# Patient Record
Sex: Female | Born: 1975
Health system: Southern US, Community
[De-identification: ages and names within clinical notes are randomized; demographics above are authoritative.]

## PROBLEM LIST (undated history)

## (undated) DIAGNOSIS — F3181 Bipolar II disorder: Secondary | ICD-10-CM

## (undated) DIAGNOSIS — G43909 Migraine, unspecified, not intractable, without status migrainosus: Secondary | ICD-10-CM

## (undated) DIAGNOSIS — G4730 Sleep apnea, unspecified: Secondary | ICD-10-CM

## (undated) DIAGNOSIS — F419 Anxiety disorder, unspecified: Secondary | ICD-10-CM

## (undated) DIAGNOSIS — E282 Polycystic ovarian syndrome: Secondary | ICD-10-CM

## (undated) DIAGNOSIS — G629 Polyneuropathy, unspecified: Secondary | ICD-10-CM

## (undated) DIAGNOSIS — M797 Fibromyalgia: Secondary | ICD-10-CM

## (undated) HISTORY — DX: Sleep apnea, unspecified: G47.30

## (undated) HISTORY — PX: CHOLECYSTECTOMY: SHX55

## (undated) HISTORY — DX: Morbid (severe) obesity due to excess calories: E66.01

## (undated) HISTORY — DX: Polyneuropathy, unspecified: G62.9

## (undated) HISTORY — DX: Migraine, unspecified, not intractable, without status migrainosus: G43.909

## (undated) HISTORY — DX: Anxiety disorder, unspecified: F41.9

## (undated) HISTORY — DX: Fibromyalgia: M79.7

## (undated) HISTORY — PX: OTHER SURGICAL HISTORY: SHX169

## (undated) HISTORY — PX: WISDOM TOOTH EXTRACTION: SHX21

## (undated) NOTE — *Deleted (*Deleted)
Chronic rhinitis  Continue cetirizine 10 mg once a day as needed for runny nose Continue saline nasal rinse or saline nasal spray as needed for nasal symptoms. Continue to follow-up with neurology concerning your migraines  Please let us know if this treatment plan is not working well for you Schedule follow-up appointment

---

## 1997-08-04 ENCOUNTER — Emergency Department (HOSPITAL_COMMUNITY): Admission: EM | Admit: 1997-08-04 | Discharge: 1997-08-04 | Payer: Self-pay | Admitting: Emergency Medicine

## 1997-11-08 ENCOUNTER — Emergency Department (HOSPITAL_COMMUNITY): Admission: EM | Admit: 1997-11-08 | Discharge: 1997-11-08 | Payer: Self-pay | Admitting: Family Medicine

## 1998-03-30 ENCOUNTER — Ambulatory Visit (HOSPITAL_COMMUNITY): Admission: RE | Admit: 1998-03-30 | Discharge: 1998-03-30 | Payer: Self-pay | Admitting: Family Medicine

## 1998-03-30 ENCOUNTER — Encounter: Payer: Self-pay | Admitting: Family Medicine

## 1998-11-29 ENCOUNTER — Emergency Department (HOSPITAL_COMMUNITY): Admission: EM | Admit: 1998-11-29 | Discharge: 1998-11-29 | Payer: Self-pay | Admitting: Emergency Medicine

## 1998-12-02 ENCOUNTER — Ambulatory Visit (HOSPITAL_COMMUNITY): Admission: RE | Admit: 1998-12-02 | Discharge: 1998-12-02 | Payer: Self-pay | Admitting: *Deleted

## 1998-12-02 ENCOUNTER — Encounter: Payer: Self-pay | Admitting: *Deleted

## 1998-12-15 ENCOUNTER — Ambulatory Visit (HOSPITAL_COMMUNITY): Admission: RE | Admit: 1998-12-15 | Discharge: 1998-12-15 | Payer: Self-pay | Admitting: Radiation Oncology

## 1999-01-04 ENCOUNTER — Ambulatory Visit (HOSPITAL_COMMUNITY): Admission: RE | Admit: 1999-01-04 | Discharge: 1999-01-05 | Payer: Self-pay | Admitting: Surgery

## 1999-01-04 ENCOUNTER — Encounter (INDEPENDENT_AMBULATORY_CARE_PROVIDER_SITE_OTHER): Payer: Self-pay | Admitting: Specialist

## 1999-03-31 ENCOUNTER — Encounter (INDEPENDENT_AMBULATORY_CARE_PROVIDER_SITE_OTHER): Payer: Self-pay | Admitting: Specialist

## 1999-03-31 ENCOUNTER — Ambulatory Visit (HOSPITAL_COMMUNITY): Admission: RE | Admit: 1999-03-31 | Discharge: 1999-03-31 | Payer: Self-pay | Admitting: Gastroenterology

## 1999-06-16 ENCOUNTER — Ambulatory Visit (HOSPITAL_COMMUNITY): Admission: RE | Admit: 1999-06-16 | Discharge: 1999-06-16 | Payer: Self-pay | Admitting: Family Medicine

## 1999-06-16 ENCOUNTER — Encounter: Payer: Self-pay | Admitting: Family Medicine

## 1999-07-15 ENCOUNTER — Ambulatory Visit (HOSPITAL_COMMUNITY): Admission: RE | Admit: 1999-07-15 | Discharge: 1999-07-15 | Payer: Self-pay | Admitting: Gastroenterology

## 1999-07-15 ENCOUNTER — Encounter: Payer: Self-pay | Admitting: Gastroenterology

## 1999-07-19 ENCOUNTER — Emergency Department (HOSPITAL_COMMUNITY): Admission: EM | Admit: 1999-07-19 | Discharge: 1999-07-19 | Payer: Self-pay | Admitting: Emergency Medicine

## 2002-04-30 ENCOUNTER — Encounter: Payer: Self-pay | Admitting: Emergency Medicine

## 2002-04-30 ENCOUNTER — Emergency Department (HOSPITAL_COMMUNITY): Admission: EM | Admit: 2002-04-30 | Discharge: 2002-04-30 | Payer: Self-pay | Admitting: Emergency Medicine

## 2002-05-09 ENCOUNTER — Other Ambulatory Visit: Admission: RE | Admit: 2002-05-09 | Discharge: 2002-05-09 | Payer: Self-pay | Admitting: Gynecology

## 2004-06-14 ENCOUNTER — Ambulatory Visit: Payer: Self-pay | Admitting: Psychiatry

## 2004-06-14 ENCOUNTER — Inpatient Hospital Stay (HOSPITAL_COMMUNITY): Admission: EM | Admit: 2004-06-14 | Discharge: 2004-06-17 | Payer: Self-pay | Admitting: Psychiatry

## 2004-06-16 ENCOUNTER — Ambulatory Visit (HOSPITAL_COMMUNITY): Admission: RE | Admit: 2004-06-16 | Discharge: 2004-06-16 | Payer: Self-pay | Admitting: Psychiatry

## 2006-03-28 ENCOUNTER — Emergency Department (HOSPITAL_COMMUNITY): Admission: EM | Admit: 2006-03-28 | Discharge: 2006-03-28 | Payer: Self-pay | Admitting: Emergency Medicine

## 2006-07-28 ENCOUNTER — Other Ambulatory Visit: Admission: RE | Admit: 2006-07-28 | Discharge: 2006-07-28 | Payer: Self-pay | Admitting: Family Medicine

## 2006-10-26 ENCOUNTER — Encounter: Admission: RE | Admit: 2006-10-26 | Discharge: 2007-01-15 | Payer: Self-pay | Admitting: *Deleted

## 2007-04-17 ENCOUNTER — Encounter: Admission: RE | Admit: 2007-04-17 | Discharge: 2007-04-17 | Payer: Self-pay | Admitting: Family Medicine

## 2007-08-29 ENCOUNTER — Emergency Department (HOSPITAL_COMMUNITY): Admission: EM | Admit: 2007-08-29 | Discharge: 2007-08-29 | Payer: Self-pay | Admitting: Physician Assistant

## 2007-09-01 ENCOUNTER — Other Ambulatory Visit: Payer: Self-pay

## 2007-09-01 ENCOUNTER — Other Ambulatory Visit: Payer: Self-pay | Admitting: Emergency Medicine

## 2007-09-02 ENCOUNTER — Inpatient Hospital Stay (HOSPITAL_COMMUNITY): Admission: AD | Admit: 2007-09-02 | Discharge: 2007-09-05 | Payer: Self-pay | Admitting: *Deleted

## 2007-09-02 ENCOUNTER — Ambulatory Visit: Payer: Self-pay | Admitting: *Deleted

## 2008-04-08 ENCOUNTER — Other Ambulatory Visit: Admission: RE | Admit: 2008-04-08 | Discharge: 2008-04-08 | Payer: Self-pay | Admitting: Family Medicine

## 2008-09-15 ENCOUNTER — Encounter: Admission: RE | Admit: 2008-09-15 | Discharge: 2008-09-15 | Payer: Self-pay | Admitting: Family Medicine

## 2009-03-09 ENCOUNTER — Emergency Department (HOSPITAL_COMMUNITY): Admission: EM | Admit: 2009-03-09 | Discharge: 2009-03-10 | Payer: Self-pay | Admitting: Emergency Medicine

## 2009-04-27 ENCOUNTER — Encounter: Admission: RE | Admit: 2009-04-27 | Discharge: 2009-04-27 | Payer: Self-pay | Admitting: Family Medicine

## 2009-11-04 ENCOUNTER — Emergency Department (HOSPITAL_COMMUNITY): Admission: EM | Admit: 2009-11-04 | Discharge: 2009-11-04 | Payer: Self-pay | Admitting: Emergency Medicine

## 2010-03-22 ENCOUNTER — Encounter: Payer: Self-pay | Admitting: Family Medicine

## 2010-04-13 ENCOUNTER — Other Ambulatory Visit (HOSPITAL_COMMUNITY)
Admission: RE | Admit: 2010-04-13 | Discharge: 2010-04-13 | Disposition: A | Discharge status: Home / Self Care | Payer: PRIVATE HEALTH INSURANCE | Source: Ambulatory Visit | Attending: Family Medicine | Admitting: Family Medicine

## 2010-04-13 ENCOUNTER — Other Ambulatory Visit: Payer: Self-pay | Admitting: Family Medicine

## 2010-04-13 DIAGNOSIS — Z01419 Encounter for gynecological examination (general) (routine) without abnormal findings: Secondary | ICD-10-CM | POA: Insufficient documentation

## 2010-05-13 LAB — POCT URINALYSIS DIPSTICK
Bilirubin Urine: NEGATIVE
Glucose, UA: NEGATIVE mg/dL
Protein, ur: NEGATIVE mg/dL
Specific Gravity, Urine: 1.015 (ref 1.005–1.030)
Urobilinogen, UA: 0.2 mg/dL (ref 0.0–1.0)
pH: 6 (ref 5.0–8.0)

## 2010-05-13 LAB — POCT PREGNANCY, URINE: Preg Test, Ur: NEGATIVE

## 2010-05-16 LAB — COMPREHENSIVE METABOLIC PANEL
ALT: 17 U/L (ref 0–35)
AST: 16 U/L (ref 0–37)
Albumin: 3.3 g/dL — ABNORMAL LOW (ref 3.5–5.2)
Alkaline Phosphatase: 81 U/L (ref 39–117)
CO2: 27 mEq/L (ref 19–32)
Chloride: 102 mEq/L (ref 96–112)
GFR calc Af Amer: >60 mL/min (ref >60)
GFR calc non Af Amer: >60 mL/min (ref >60)
Potassium: 3.9 mEq/L (ref 3.5–5.1)
Sodium: 136 mEq/L (ref 135–145)
Total Bilirubin: 0.3 mg/dL (ref 0.3–1.2)

## 2010-05-16 LAB — DIFFERENTIAL
Basophils Relative: 0 % (ref 0–1)
Eosinophils Absolute: 0.3 10*3/uL (ref 0.0–0.7)
Lymphocytes Relative: 19 % (ref 12–46)
Lymphs Abs: 2.7 10*3/uL (ref 0.7–4.0)
Monocytes Absolute: 0.7 10*3/uL (ref 0.1–1.0)

## 2010-05-16 LAB — CBC
Hemoglobin: 12.4 g/dL (ref 12.0–15.0)
MCHC: 32.7 g/dL (ref 30.0–36.0)
RBC: 4.65 MIL/uL (ref 3.87–5.11)

## 2010-05-16 LAB — CK TOTAL AND CKMB (NOT AT ARMC): Total CK: 76 U/L (ref 7–177)

## 2010-05-16 LAB — TROPONIN I: Troponin I: 0.01 ng/mL (ref 0.00–0.06)

## 2010-05-16 LAB — D-DIMER, QUANTITATIVE: D-Dimer, Quant: 0.27 ug/mL-FEU (ref 0.00–0.48)
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
## 2010-07-16 NOTE — Procedures (Signed)
[facility]  Patient:    Krystal French, Krystal French                        MRN: [id number] Proc. Date: 07/15/99 Adm. Date:  [id number] Disc. Date: [id number] Attending:  Devoria Albe CC:         Meredith Staggers, M.D.             Abigail Miyamoto, M.D.                           Procedure Report  PROCEDURE:  Endoscopic retrograde cholangiopancreatography with sphincterotomy.  INDICATIONS FOR PROCEDURE:  The patient is status post cholecystectomy with gallstones found at the time of surgery who has continued to have epigastric abdominal pain and nausea as well as some lower abdominal cramps and diarrhea. She has not responded to antipeptic medications and antispasmodics.  Abdominal CT scan and EGD were all essentially unrevealing.  We have decided to pursue ERCP to rule out common bile duct stone, since intraoperative cholangiogram was not obtained at surgery.   The risks of the procedure including pancreatitis, bleeding and perforation, as well as the limitations and occasional failure to cannulate the  common bile duct were explained to the patient and her parents and husband, and  they all wish to proceed.  DESCRIPTION OF PROCEDURE:  The Olympus side viewing endoscope was advanced blindly into the oropharynx, esophagus and stomach.  Examination of the stomach was unremarkable.  The pylorus was traversed and the papilla of Vater located on the medial duodenal wall.  It had a normal appearance and bile was seen to exit from the ampulla.  With the Wilson-Cook sphincterotome, shallow cannulation of the common bile duct was achieved and a cholangiogram was obtained.  It appeared that there was a round filling defect in the distal duct, and a cholangiogram was obtained demonstrating this.  It appeared consistent with a stone.  However, after deep cannulation with a guidewire and subsequent deep cannulation with the sphincterotome, this filling defect was no  longer seen.  In consultation with Dr. Anselmo Pickler, the films were reviewed, and it was felt that this could represent a stone, but could possibility have represented a kink in the distal duct caused y the initial pressure of the sphincterotome on the ampulla.  It was not possible to completely resolve this issue, but filling defects were not seen on any other cholangiogram.  The pancreatic duct was not injected.  After careful consideration of the radiologic findings and the clinical scenario, I decided to perform a sphincterotomy.  It should be noted that the common bile duct was smooth and not dilated, and the cystic duct stump was clearly identified with no stone seen there. A 1 cm sphincterotomy was performed and then the sphincterotome was withdrawn and a  12 mm balloon catheter advanced high into the common hepatic duct, inflated and withdrawn with no filling defect seen.  It was reintroduced, inflated distally nd repeat cholangiogram obtained.  Again, no filling defects were introduced, but ne more pass was made with the balloon.  The scope was then withdrawn and the patient returned to the recovery room in stable condition.  She tolerated the procedure  well and there were no immediate complications.  IMPRESSION:  Question of common bile duct stone seen primarily on one film status post sphincterotomy and balloon sweep with no stone seen to be delivered.  PLAN:  Will  advance diet and observe response to procedure.  Pursue further workup as indicated based on response. DD:  07/15/99 TD:  07/20/99 Job: [id number]
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
## 2010-07-16 NOTE — Discharge Summary (Signed)
Krystal French, Krystal French NO.:  [id number]   MEDICAL RECORD NO.:  [id number]          PATIENT TYPE:  IPS   LOCATION:  0305                          FACILITY:  [facility]   PHYSICIAN:  Anselm Jungling, MD  DATE OF BIRTH:  Jan 02, 1976   DATE OF ADMISSION:  09/02/2007  DATE OF DISCHARGE:  09/05/2007                               DISCHARGE SUMMARY   IDENTIFYING DATA/REASON FOR ADMISSION:  The patient is a 35 year old  married white female admitted due to worsening depression and in the  aftermath of an overdose suicide attempt.  She came to [place] with a history  of bipolar disorder and reported history of frequent cycling.  Her usual  psychotropic medications included Abilify, Topamax, Lamictal, Effexor,  and Klonopin.  She had been seeing Dr. Darlin Drop for her psychiatric  medication management.  Please refer to the admission note for further  details pertaining to the symptoms, circumstances, and history that led  to her hospitalization.  She was given an initial Axis I diagnosis of  depressive disorder NOS and history of bipolar disorder NOS.   MEDICAL AND LABORATORY:  The patient was medically and physically  assessed by the psychiatric nurse practitioner upon admission.  She was  somewhat overweight, but generally in good health without any active or  chronic medical problems.  There were no significant medical issues.   HOSPITAL COURSE:  The patient was admitted to the adult inpatient  psychiatric service.  She presented as an obese female who initially was  interviewed in her room, as she remained in bed.  She was awake but  responded with little interaction.  She was fully oriented.  She made no  delusional statements.  She indicated that she wanted to sleep.  She had  denied suicidal ideation since admission and had expressed regret for  her overdose.  She expressed a desire for help.   She was continued on her usual regimen of Lamictal, Effexor, Abilify and  Topamax.  She was involved in therapeutic groups and activities.   She was more available to discuss the situation on the second full  hospital day.  When I met with her, she stated, I feel really stupid;  it was not a smart thing to do.  She reported that this was her first  ever suicide attempt.  She indicated that her husband was supportive,  and later that day they had a family meeting.   The patient explained that in the past her depressive cycles had  usually resolved spontaneously after only a few days; however, this  episode lasted 2 to 3 weeks despite efforts at medication adjustments  that unfortunately had been unsuccessful.  Also, shortly prior to this  bout of depression, she had had an apparent gastrointestinal illness  that involved some dehydration.   The patient and her husband met for a family session.  They discussed  precursors to her admission, her progress, and aftercare needs.  The  patient was able to contract for safety at that time and indicated that  she felt comfortable with discharge home.  Her husband was  comfortable  with this as well.  She requested referral to an individual  psychotherapist, which she had not had prior to admission.   AFTERCARE:  The patient was to follow up with Darlin Drop on September 10, 2007.  She was also referred to [facility] for individual psychotherapy  with an appointment on September 06, 2007, at 2:30 p.m.   DISCHARGE MEDICATIONS:  Lamictal 200 mg q.h.s., Effexor XR 150 mg  q.h.s., Abilify 15 mg q.h.s., and Topamax 100 mg q.h.s.   DISCHARGE DIAGNOSES:  AXIS I:  Bipolar disorder NOS, most recently  depressed without psychotic features, resolving.  AXIS II:  Deferred.  AXIS III:  No acute or chronic illnesses.  AXIS IV:  Stressors severe.  AXIS V:  GAF on discharge 65.      Anselm Jungling, MD  Electronically Signed     SPB/MEDQ  D:  09/11/2007  T:  09/11/2007  Job:  [id number]

## 2010-11-25 LAB — COMPREHENSIVE METABOLIC PANEL
Albumin: 3.6
BUN: 9
Chloride: 104
Creatinine, Ser: 1.1
Total Bilirubin: 0.4
Total Protein: 7

## 2010-11-25 LAB — URINALYSIS, ROUTINE W REFLEX MICROSCOPIC
Bilirubin Urine: NEGATIVE
Hgb urine dipstick: NEGATIVE
Nitrite: NEGATIVE
Specific Gravity, Urine: 1.024

## 2010-11-25 LAB — RAPID URINE DRUG SCREEN, HOSP PERFORMED
Amphetamines: NOT DETECTED
Benzodiazepines: NOT DETECTED
Cocaine: NOT DETECTED
Opiates: NOT DETECTED

## 2010-11-25 LAB — DIFFERENTIAL
Basophils Absolute: 0.1
Eosinophils Absolute: 0.1
Eosinophils Relative: 1

## 2010-11-25 LAB — POCT I-STAT, CHEM 8
BUN: 9
Calcium, Ion: 1.24
Chloride: 105
Creatinine, Ser: 1.1
HCT: 47 — ABNORMAL HIGH
Hemoglobin: 16 — ABNORMAL HIGH
Sodium: 138

## 2010-11-25 LAB — BASIC METABOLIC PANEL
BUN: 10
CO2: 25
GFR calc non Af Amer: >60
Glucose, Bld: 103 — ABNORMAL HIGH
Potassium: 4
Sodium: 136

## 2010-11-25 LAB — CBC
HCT: 42.8
MCV: 83.1
Platelets: 293
RDW: 13.8

## 2010-11-25 LAB — URINE MICROSCOPIC-ADD ON

## 2010-11-25 LAB — SALICYLATE LEVEL: Salicylate Lvl: <4

## 2010-11-25 LAB — TRICYCLICS SCREEN, URINE: TCA Scrn: NOT DETECTED

## 2011-02-28 ENCOUNTER — Ambulatory Visit
Admission: RE | Admit: 2011-02-28 | Discharge: 2011-02-28 | Disposition: A | Discharge status: Home / Self Care | Payer: PRIVATE HEALTH INSURANCE | Source: Ambulatory Visit | Attending: Family Medicine | Admitting: Family Medicine

## 2011-02-28 ENCOUNTER — Other Ambulatory Visit: Payer: Self-pay | Admitting: Family Medicine

## 2011-02-28 DIAGNOSIS — R109 Unspecified abdominal pain: Secondary | ICD-10-CM

## 2011-08-01 ENCOUNTER — Ambulatory Visit
Admission: RE | Admit: 2011-08-01 | Discharge: 2011-08-01 | Disposition: A | Discharge status: Home / Self Care | Payer: PRIVATE HEALTH INSURANCE | Source: Ambulatory Visit | Attending: Family Medicine | Admitting: Family Medicine

## 2011-08-01 ENCOUNTER — Other Ambulatory Visit: Payer: Self-pay | Admitting: Family Medicine

## 2011-08-01 DIAGNOSIS — M25569 Pain in unspecified knee: Secondary | ICD-10-CM

## 2011-10-02 ENCOUNTER — Emergency Department (HOSPITAL_COMMUNITY): Payer: Medicare Other

## 2011-10-02 ENCOUNTER — Encounter (HOSPITAL_COMMUNITY): Payer: Self-pay | Admitting: *Deleted

## 2011-10-02 ENCOUNTER — Emergency Department (HOSPITAL_COMMUNITY)
Admission: EM | Admit: 2011-10-02 | Discharge: 2011-10-02 | Disposition: A | Discharge status: Home / Self Care | Payer: Medicare Other | Attending: Emergency Medicine | Admitting: Emergency Medicine

## 2011-10-02 DIAGNOSIS — R079 Chest pain, unspecified: Secondary | ICD-10-CM | POA: Insufficient documentation

## 2011-10-02 DIAGNOSIS — F3189 Other bipolar disorder: Secondary | ICD-10-CM | POA: Insufficient documentation

## 2011-10-02 DIAGNOSIS — Z79899 Other long term (current) drug therapy: Secondary | ICD-10-CM | POA: Insufficient documentation

## 2011-10-02 DIAGNOSIS — Z9089 Acquired absence of other organs: Secondary | ICD-10-CM | POA: Insufficient documentation

## 2011-10-02 HISTORY — DX: Polycystic ovarian syndrome: E28.2

## 2011-10-02 HISTORY — DX: Bipolar II disorder: F31.81

## 2011-10-02 HISTORY — DX: Migraine, unspecified, not intractable, without status migrainosus: G43.909

## 2011-10-02 LAB — CBC WITH DIFFERENTIAL/PLATELET
Basophils Relative: 0 % (ref 0–1)
Eosinophils Absolute: 0.3 10*3/uL (ref 0.0–0.7)
Eosinophils Relative: 3 % (ref 0–5)
Hemoglobin: 12.4 g/dL (ref 12.0–15.0)
Lymphs Abs: 3.5 10*3/uL (ref 0.7–4.0)
MCH: 26.7 pg (ref 26.0–34.0)
MCHC: 32 g/dL (ref 30.0–36.0)
MCV: 83.4 fL (ref 78.0–100.0)
Monocytes Relative: 6 % (ref 3–12)
Neutrophils Relative %: 59 % (ref 43–77)
Platelets: 312 10*3/uL (ref 150–400)

## 2011-10-02 LAB — COMPREHENSIVE METABOLIC PANEL
Albumin: 3.3 g/dL — ABNORMAL LOW (ref 3.5–5.2)
Alkaline Phosphatase: 88 U/L (ref 39–117)
BUN: 9 mg/dL (ref 6–23)
Calcium: 9.4 mg/dL (ref 8.4–10.5)
GFR calc Af Amer: >90 mL/min (ref >90)
Glucose, Bld: 113 mg/dL — ABNORMAL HIGH (ref 70–99)
Potassium: 3.9 mEq/L (ref 3.5–5.1)
Total Protein: 7 g/dL (ref 6.0–8.3)

## 2011-10-02 LAB — PREGNANCY, URINE: Preg Test, Ur: NEGATIVE

## 2011-10-02 LAB — TROPONIN I
Troponin I: <0.3 ng/mL (ref <0.30)
Troponin I: <0.3 ng/mL (ref <0.30)

## 2011-10-02 LAB — D-DIMER, QUANTITATIVE: D-Dimer, Quant: 0.28 ug/mL-FEU (ref 0.00–0.48)

## 2011-10-02 MED ORDER — ASPIRIN 325 MG PO TABS
325.0000 mg | ORAL_TABLET | Freq: Once | ORAL | Status: AC
  Administered 2011-10-02: 325 mg via ORAL
  Filled 2011-10-02: qty 1

## 2011-10-02 MED ORDER — MORPHINE SULFATE 4 MG/ML IJ SOLN
4.0000 mg | Freq: Once | INTRAMUSCULAR | Status: AC
  Administered 2011-10-02: 4 mg via INTRAVENOUS
  Filled 2011-10-02: qty 1

## 2011-10-02 NOTE — ED Notes (Signed)
Discharge instructions explained to pt and pt was provided w/ a copy of D/C instructions.  Pt verbalized understanding of instructions.

## 2011-10-02 NOTE — ED Notes (Signed)
Patient is alert and oriented x3.  She is complaining of chest pain that started at 7am. She states that she does have a history of anxiety with chest pain that this chest pain  Feels different.  Currently she rates her pain at a 5 of 10 that radiates to her back and left  Arm.

## 2011-10-02 NOTE — ED Provider Notes (Addendum)
History     CSN: [id number]  Arrival date & time 10/02/11  4098   First MD Initiated Contact with Patient 10/02/11 [phone]      Chief Complaint  Patient presents with  . Chest Pain    (Consider location/radiation/quality/duration/timing/severity/associated sxs/prior treatment) HPI Comments: Krystal French is a 36 y.o. Female hx of bipolar, PCOS (not on OCP), OSA, here with CP. CP substernal, radiating to bilateral shoulders that woke her up this AM. Also some mild SOB but no wheezing or cough or fever. She felt it may be anxiety and took 1mg  of ativan and didn't feel better. Pain 5/10 currently. Not on OCP, no recent travel or leg swelling. She is never a smoker but has family hx of HL and grandparents had MIs.    The history is provided by the patient.    Past Medical History  Diagnosis Date  . Bipolar 2 disorder   . Migraines   . Polycystic ovaries     Past Surgical History  Procedure Date  . Cholecystectomy   . Wisdom tooth extraction     History reviewed. No pertinent family history.  History  Substance Use Topics  . Smoking status: Never Smoker   . Smokeless tobacco: Not on file  . Alcohol Use: No    OB History    Grav Para Term Preterm Abortions TAB SAB Ect Mult Living                  Review of Systems  Respiratory: Positive for chest tightness and shortness of breath.   Cardiovascular: Positive for chest pain.  All other systems reviewed and are negative.    Allergies  Latex and Sulfa antibiotics  Home Medications   Current Outpatient Rx  Name Route Sig Dispense Refill  . ACETAMINOPHEN 500 MG PO TABS Oral Take 1,000 mg by mouth every 6 (six) hours as needed. For pain.    Marland Kitchen DIVALPROEX SODIUM ER 250 MG PO TB24 Oral Take 750 mg by mouth daily.    . OMEGA-3 FATTY ACIDS 1000 MG PO CAPS Oral Take 1 g by mouth daily.    Marland Kitchen FOLIC ACID 1 MG PO TABS Oral Take 1 mg by mouth daily.    Marland Kitchen LAMOTRIGINE 200 MG PO TABS Oral Take 200 mg by mouth daily.    Marland Kitchen  LORAZEPAM 2 MG PO TABS Oral Take 2 mg by mouth every 8 (eight) hours as needed. For anxiety/sleep.    . SERTRALINE HCL 100 MG PO TABS Oral Take 200 mg by mouth daily.      BP 149/79  Pulse 78  Resp 18  SpO2 98%  LMP 09/04/2011  Physical Exam  Constitutional: She is oriented to person, place, and time.       Morbidly obese, uncomfortable, tachypneic.   HENT:  Head: Normocephalic.  Eyes: Conjunctivae and EOM are normal. Pupils are equal, round, and reactive to light.  Neck: Normal range of motion. Neck supple.  Cardiovascular: Normal rate, normal heart sounds and intact distal pulses.   Pulmonary/Chest: Breath sounds normal.       Tachypneic, no wheezing or rales. Decreased breaths sound bilaterally possibly due to body habitus.   Abdominal: Soft. Bowel sounds are normal.  Musculoskeletal: Normal range of motion. She exhibits no edema.       No edema, no calf tenderness  Neurological: She is alert and oriented to person, place, and time.  Skin: Skin is warm.  Psychiatric: She has a normal mood and affect. Her  behavior is normal. Judgment and thought content normal.    ED Course  Procedures (including critical care time)  Labs Reviewed  CBC WITH DIFFERENTIAL - Abnormal; Notable for the following:    WBC 11.0 (*)     All other components within normal limits  COMPREHENSIVE METABOLIC PANEL - Abnormal; Notable for the following:    Glucose, Bld 113 (*)     Albumin 3.3 (*)     Total Bilirubin 0.1 (*)     GFR calc non Af Amer 82 (*)     All other components within normal limits  TROPONIN I  D-DIMER, QUANTITATIVE  PREGNANCY, URINE  TROPONIN I   Dg Chest 2 View  10/02/2011  *RADIOLOGY REPORT*  Clinical Data: chest pain  CHEST - 2 VIEW  Comparison: 03/10/2009  Findings: The heart size and mediastinal contours are within normal limits.  Both lungs are clear.  The visualized skeletal structures are unremarkable.  IMPRESSION: Negative examination.  Original Report Authenticated By:  Rosealee Albee, M.D.     1. Chest pain      Date: 10/02/2011  Rate: 80  Rhythm: normal sinus rhythm  QRS Axis: normal  Intervals: normal  ST/T Wave abnormalities: normal  Conduction Disutrbances:none  Narrative Interpretation:   Old EKG Reviewed: none available    MDM  Krystal French is a 36 y.o. female with hx of OSA, bipolar, PCOS with CP and SOB. Patient has no known CAD. Will need to get trop x 2, labs, cxr. Also consider PE given hx of PCOS (off OCP), will order D-dimer. Will reassess patient.   1:13 PM Patient is now pain free. Trop x 2 neg, d dimer nl. She has follow up. Will d/c with cardiology f/u.        Richardean Canal, MD 10/02/11 1313  Patient low risk for ACS can have outpatient f/u.   Richardean Canal, MD 10/02/11 1315

## 2011-10-07 ENCOUNTER — Other Ambulatory Visit: Payer: Self-pay | Admitting: Family Medicine

## 2011-10-07 ENCOUNTER — Other Ambulatory Visit (HOSPITAL_COMMUNITY)
Admission: RE | Admit: 2011-10-07 | Discharge: 2011-10-07 | Disposition: A | Discharge status: Home / Self Care | Payer: Medicare Other | Source: Ambulatory Visit | Attending: Family Medicine | Admitting: Family Medicine

## 2011-10-07 DIAGNOSIS — Z124 Encounter for screening for malignant neoplasm of cervix: Secondary | ICD-10-CM | POA: Insufficient documentation

## 2012-01-11 ENCOUNTER — Other Ambulatory Visit: Payer: Self-pay | Admitting: Dermatology

## 2012-05-09 ENCOUNTER — Other Ambulatory Visit: Payer: Self-pay | Admitting: Orthopedic Surgery

## 2012-05-09 DIAGNOSIS — M25561 Pain in right knee: Secondary | ICD-10-CM

## 2012-05-16 ENCOUNTER — Other Ambulatory Visit: Payer: Medicare Other

## 2012-09-07 ENCOUNTER — Emergency Department (HOSPITAL_COMMUNITY)
Admission: EM | Admit: 2012-09-07 | Discharge: 2012-09-07 | Disposition: A | Discharge status: Home / Self Care | Payer: Medicare Other | Attending: Emergency Medicine | Admitting: Emergency Medicine

## 2012-09-07 ENCOUNTER — Encounter (HOSPITAL_COMMUNITY): Payer: Self-pay

## 2012-09-07 DIAGNOSIS — IMO0001 Reserved for inherently not codable concepts without codable children: Secondary | ICD-10-CM | POA: Insufficient documentation

## 2012-09-07 DIAGNOSIS — Z8639 Personal history of other endocrine, nutritional and metabolic disease: Secondary | ICD-10-CM | POA: Insufficient documentation

## 2012-09-07 DIAGNOSIS — Z79899 Other long term (current) drug therapy: Secondary | ICD-10-CM | POA: Insufficient documentation

## 2012-09-07 DIAGNOSIS — Z862 Personal history of diseases of the blood and blood-forming organs and certain disorders involving the immune mechanism: Secondary | ICD-10-CM | POA: Insufficient documentation

## 2012-09-07 DIAGNOSIS — Z3202 Encounter for pregnancy test, result negative: Secondary | ICD-10-CM | POA: Insufficient documentation

## 2012-09-07 DIAGNOSIS — Z91040 Latex allergy status: Secondary | ICD-10-CM | POA: Insufficient documentation

## 2012-09-07 DIAGNOSIS — G43909 Migraine, unspecified, not intractable, without status migrainosus: Secondary | ICD-10-CM | POA: Insufficient documentation

## 2012-09-07 DIAGNOSIS — F319 Bipolar disorder, unspecified: Secondary | ICD-10-CM | POA: Insufficient documentation

## 2012-09-07 DIAGNOSIS — M797 Fibromyalgia: Secondary | ICD-10-CM

## 2012-09-07 LAB — ETHANOL: Alcohol, Ethyl (B): <11 mg/dL (ref 0–11)

## 2012-09-07 LAB — COMPREHENSIVE METABOLIC PANEL
AST: 11 U/L (ref 0–37)
Albumin: 3.7 g/dL (ref 3.5–5.2)
Alkaline Phosphatase: 77 U/L (ref 39–117)
BUN: 10 mg/dL (ref 6–23)
Chloride: 97 mEq/L (ref 96–112)
Potassium: 4.3 mEq/L (ref 3.5–5.1)
Sodium: 135 mEq/L (ref 135–145)
Total Bilirubin: 0.2 mg/dL — ABNORMAL LOW (ref 0.3–1.2)
Total Protein: 7.5 g/dL (ref 6.0–8.3)

## 2012-09-07 LAB — CBC
MCHC: 31.9 g/dL (ref 30.0–36.0)
Platelets: 290 10*3/uL (ref 150–400)
RDW: 14.4 % (ref 11.5–15.5)
WBC: 12.3 10*3/uL — ABNORMAL HIGH (ref 4.0–10.5)

## 2012-09-07 LAB — RAPID URINE DRUG SCREEN, HOSP PERFORMED
Amphetamines: NOT DETECTED
Benzodiazepines: NOT DETECTED
Tetrahydrocannabinol: NOT DETECTED

## 2012-09-07 MED ORDER — IBUPROFEN 200 MG PO TABS: 600.0000 mg | ORAL_TABLET | Freq: Three times a day (TID) | ORAL | Status: DC | PRN

## 2012-09-07 MED ORDER — ALUM & MAG HYDROXIDE-SIMETH 200-200-20 MG/5ML PO SUSP: 30.0000 mL | ORAL | Status: DC | PRN

## 2012-09-07 MED ORDER — ONDANSETRON HCL 4 MG PO TABS: 4.0000 mg | ORAL_TABLET | Freq: Three times a day (TID) | ORAL | Status: DC | PRN

## 2012-09-07 MED ORDER — ACETAMINOPHEN 325 MG PO TABS: 650.0000 mg | ORAL_TABLET | ORAL | Status: DC | PRN

## 2012-09-07 NOTE — ED Notes (Signed)
Pt states that shes on her third medication change for bipolar and she's having suicidal thoughts, she also complains about a fibromyalgia flare at this time
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
## 2012-09-07 NOTE — ED Notes (Signed)
Pt presents with feeling SI all day yesterday.  Denies HI, no AV hallucinations.  No hopeless feelings.  Pt admits to OD of pills x 4 yrs ago.  Pt calm & cooperative at present, denies SI at present.

## 2012-09-07 NOTE — ED Provider Notes (Signed)
History    CSN: [id number] Arrival date & time 09/07/12  0246  First MD Initiated Contact with Patient 09/07/12 0257     Chief Complaint  Patient presents with  . Medical Clearance   (Consider location/radiation/quality/duration/timing/severity/associated sxs/prior Treatment) HPI 37 yo female presents to the ER from home with her husband with c/o bipolar disorder and thoughts of suicide.  Pt recently dx with fibromyalgia, has had several recent medication changes in conjunction with her psychiatrist and rheumatologist.  Pt has prior h/o suicide attempts x 2, last about 5 years ago.  Pt was well controlled for 3 years but had changed SSRI on recommendation of rheumatologist.  She has not done well on celexa, and is being tapered down, changed to pristiq and latuda.  Pt has been "eyeing my ativan bottle".  Pt feels she is having a fibromyalgia flare as well.  Pt has f/u tomorrow with psychiatry, her therapist on Monday. Past Medical History  Diagnosis Date  . Bipolar 2 disorder   . Migraines   . Polycystic ovaries    Past Surgical History  Procedure Laterality Date  . Cholecystectomy    . Wisdom tooth extraction     History reviewed. No pertinent family history. History  Substance Use Topics  . Smoking status: Never Smoker   . Smokeless tobacco: Not on file  . Alcohol Use: No   OB History   Grav Para Term Preterm Abortions TAB SAB Ect Mult Living                 Review of Systems  All other systems reviewed and are negative.    Allergies  Latex and Sulfa antibiotics  Home Medications   Current Outpatient Rx  Name  Route  Sig  Dispense  Refill  . acetaminophen (TYLENOL) 500 MG tablet   Oral   Take 1,000 mg by mouth every 6 (six) hours as needed. For pain.         Marland Kitchen desvenlafaxine (PRISTIQ) 50 MG 24 hr tablet   Oral   Take 50 mg by mouth daily.         . divalproex (DEPAKOTE ER) 250 MG 24 hr tablet   Oral   Take 750 mg by mouth daily.         . fish  oil-omega-3 fatty acids 1000 MG capsule   Oral   Take 1 g by mouth daily.         . folic acid (FOLVITE) 1 MG tablet   Oral   Take 1 mg by mouth daily.         Marland Kitchen lamoTRIgine (LAMICTAL) 200 MG tablet   Oral   Take 200 mg by mouth daily.         Marland Kitchen LORazepam (ATIVAN) 2 MG tablet   Oral   Take 1 mg by mouth every 6 (six) hours as needed for anxiety. For anxiety/sleep.         . Lurasidone HCl (LATUDA) 20 MG TABS   Oral   Take 20 mg by mouth daily.         . ondansetron (ZOFRAN) 4 MG tablet   Oral   Take 4 mg by mouth every 8 (eight) hours as needed for nausea.          BP 135/83  Pulse 86  Temp(Src) 97.8 F (36.6 C) (Oral)  Resp 18  SpO2 96%  LMP 08/31/2012 Physical Exam  Constitutional: She is oriented to person, place, and time. She appears  well-developed and well-nourished.  Morbidly obese, sad depressed mood, flat affect  HENT:  Head: Normocephalic and atraumatic.  Nose: Nose normal.  Mouth/Throat: Oropharynx is clear and moist.  Eyes: Conjunctivae and EOM are normal. Pupils are equal, round, and reactive to light.  Neck: Normal range of motion. Neck supple. No JVD present. No tracheal deviation present. No thyromegaly present.  Cardiovascular: Normal rate, regular rhythm, normal heart sounds and intact distal pulses.  Exam reveals no gallop and no friction rub.   No murmur heard. Pulmonary/Chest: Effort normal and breath sounds normal. No stridor. No respiratory distress. She has no wheezes. She has no rales. She exhibits no tenderness.  Abdominal: Soft. Bowel sounds are normal. She exhibits no distension and no mass. There is no tenderness. There is no rebound and no guarding.  Musculoskeletal: Normal range of motion. She exhibits no edema and no tenderness.  Lymphadenopathy:    She has no cervical adenopathy.  Neurological: She is oriented to person, place, and time. She has normal reflexes. No cranial nerve deficit. She exhibits normal muscle tone.  Coordination normal.  Skin: Skin is dry. No rash noted. No erythema. No pallor.  Psychiatric: Her behavior is normal. Judgment and thought content normal.    ED Course  Procedures (including critical care time) Labs Reviewed  CBC - Abnormal; Notable for the following:    WBC 12.3 (*)    All other components within normal limits  COMPREHENSIVE METABOLIC PANEL - Abnormal; Notable for the following:    Glucose, Bld 104 (*)    Total Bilirubin 0.2 (*)    GFR calc non Af Amer 86 (*)    All other components within normal limits  ETHANOL  URINE RAPID DRUG SCREEN (HOSP PERFORMED)  POCT PREGNANCY, URINE   No results found. 1. Bipolar and related disorder   2. Fibromyalgia     MDM  37 yo female with thoughts of suicide due to ongoing medication shifts.  Pt can contract for safety, is feeling better after talking things out in the ER.  She has close f/u with her providers.  She is able to be around her inlaws and husband for safety.  Olivia Mackie, MD 09/07/12 [phone]

## 2012-10-22 ENCOUNTER — Encounter: Payer: Self-pay | Admitting: Neurology

## 2012-10-24 ENCOUNTER — Institutional Professional Consult (permissible substitution): Payer: Self-pay | Admitting: Neurology

## 2012-10-29 ENCOUNTER — Encounter (HOSPITAL_BASED_OUTPATIENT_CLINIC_OR_DEPARTMENT_OTHER): Payer: Self-pay | Admitting: *Deleted

## 2012-10-29 ENCOUNTER — Emergency Department (HOSPITAL_BASED_OUTPATIENT_CLINIC_OR_DEPARTMENT_OTHER)
Admission: EM | Admit: 2012-10-29 | Discharge: 2012-10-29 | Disposition: A | Discharge status: Home / Self Care | Payer: Medicare Other | Attending: Emergency Medicine | Admitting: Emergency Medicine

## 2012-10-29 DIAGNOSIS — F3189 Other bipolar disorder: Secondary | ICD-10-CM | POA: Insufficient documentation

## 2012-10-29 DIAGNOSIS — Z91040 Latex allergy status: Secondary | ICD-10-CM | POA: Insufficient documentation

## 2012-10-29 DIAGNOSIS — R52 Pain, unspecified: Secondary | ICD-10-CM | POA: Insufficient documentation

## 2012-10-29 DIAGNOSIS — Z87891 Personal history of nicotine dependence: Secondary | ICD-10-CM | POA: Insufficient documentation

## 2012-10-29 DIAGNOSIS — Z79899 Other long term (current) drug therapy: Secondary | ICD-10-CM | POA: Insufficient documentation

## 2012-10-29 DIAGNOSIS — G43909 Migraine, unspecified, not intractable, without status migrainosus: Secondary | ICD-10-CM | POA: Insufficient documentation

## 2012-10-29 DIAGNOSIS — E669 Obesity, unspecified: Secondary | ICD-10-CM | POA: Insufficient documentation

## 2012-10-29 LAB — GLUCOSE, CAPILLARY

## 2012-10-29 MED ORDER — HYDROMORPHONE HCL PF 1 MG/ML IJ SOLN
1.0000 mg | Freq: Once | INTRAMUSCULAR | Status: AC
  Administered 2012-10-29: 1 mg via INTRAMUSCULAR
  Filled 2012-10-29: qty 1

## 2012-10-29 MED ORDER — NAPROXEN 500 MG PO TABS: 500.0000 mg | ORAL_TABLET | Freq: Two times a day (BID) | ORAL | Status: DC

## 2012-10-29 MED ORDER — KETOROLAC TROMETHAMINE 60 MG/2ML IM SOLN
60.0000 mg | Freq: Once | INTRAMUSCULAR | Status: AC
  Administered 2012-10-29: 60 mg via INTRAMUSCULAR
  Filled 2012-10-29: qty 2

## 2012-10-29 NOTE — ED Notes (Signed)
Pt to room 7 by ems via stretcher. Pt is able to stand and walk to bed, pt is crying hysterically, sobbing and screaming "I'm hurting all over...". Pt husband states she has been depressed today. Pt denies any si or hi.

## 2012-10-29 NOTE — ED Notes (Signed)
The patient stated to her husband after the RN Amy gave her the medication that was I just want to sleep please make it all go away.

## 2012-10-29 NOTE — ED Provider Notes (Signed)
CSN: [id number]     Arrival date & time 10/29/12  1742 History  This chart was scribed for Krystal Roller, MD by Leone Payor, ED Scribe. This patient was seen in room MH07/MH07 and the patient's care was started 5:51 PM.    Chief Complaint  Patient presents with  . Pain    The history is provided by the patient and the spouse. No language interpreter was used.    HPI Comments: Krystal French is a 37 y.o. female brought in by ambulance, who presents to the Emergency Department complaining of pain to the entire body that began 2 days ago but worsened today. She describes this pain as soreness that began on 10/26/12. She has a history of fibromyalgia, polycystic ovaries, bipolar 2 disorder, migraines. She denies having a migraine currently. She reports this current pain is much worse than her normal fibromyalgia pain. She has taken hydrocodone today. She was started on Cymbalta several months ago which resulted in aggressive manic behaviors and SI for which she was observed in the ED. She denies having a manic episode currently and denies symptoms such mind racing. She denies feeling increased depression in the last few days. She denies any new OTC medications recently. Pt had a snickers bar and an ice coffee today. She denies fever, cough, dysuria, photophobia, current rash.   Past Medical History  Diagnosis Date  . Bipolar 2 disorder   . Migraines   . Polycystic ovaries   . Migraine   . Morbid obesity   . Sleep apnea    Past Surgical History  Procedure Laterality Date  . Cholecystectomy    . Wisdom tooth extraction     Family History  Problem Relation Age of Onset  . Sleep apnea Father    History  Substance Use Topics  . Smoking status: Former Games developer  . Smokeless tobacco: Not on file     Comment: quit in 2000  . Alcohol Use: No   OB History   Grav Para Term Preterm Abortions TAB SAB Ect Mult Living                 Review of Systems A complete 10 system review of systems was  obtained and all systems are negative except as noted in the HPI and PMH.   Allergies  Codeine; Morphine and related; Latex; and Sulfa antibiotics  Home Medications   Current Outpatient Rx  Name  Route  Sig  Dispense  Refill  . QUEtiapine (SEROQUEL) 400 MG tablet   Oral   Take 400 mg by mouth at bedtime.         Marland Kitchen acetaminophen (TYLENOL) 500 MG tablet   Oral   Take 1,000 mg by mouth every 6 (six) hours as needed. For pain.         Marland Kitchen desvenlafaxine (PRISTIQ) 50 MG 24 hr tablet   Oral   Take 50 mg by mouth daily.         . divalproex (DEPAKOTE ER) 250 MG 24 hr tablet   Oral   Take 750 mg by mouth daily.         . fish oil-omega-3 fatty acids 1000 MG capsule   Oral   Take 1 g by mouth daily.         . folic acid (FOLVITE) 1 MG tablet   Oral   Take 1 mg by mouth daily.         Marland Kitchen lamoTRIgine (LAMICTAL) 200 MG tablet   Oral  Take 200 mg by mouth daily.         Marland Kitchen LORazepam (ATIVAN) 2 MG tablet   Oral   Take 1 mg by mouth every 6 (six) hours as needed for anxiety. For anxiety/sleep.         . Lurasidone HCl (LATUDA) 20 MG TABS   Oral   Take 20 mg by mouth daily.         . ondansetron (ZOFRAN) 4 MG tablet   Oral   Take 4 mg by mouth every 8 (eight) hours as needed for nausea.          Pulse 88  Temp(Src) 98.2 F (36.8 C)  Resp 20  Ht 5\' 11"  (1.803 m)  Wt 390 lb (176.903 kg)  BMI 54.42 kg/m2  SpO2 100%  LMP 10/22/2012 Physical Exam  Nursing note and vitals reviewed. Constitutional: She is oriented to person, place, and time. She appears well-developed and well-nourished. No distress.  HENT:  Head: Normocephalic and atraumatic.  Mouth/Throat: Oropharynx is clear and moist.  Eyes: Conjunctivae are normal. Pupils are equal, round, and reactive to light. No scleral icterus.  Neck: Neck supple.  Cardiovascular: Normal rate, regular rhythm, normal heart sounds and intact distal pulses.   No murmur heard. Pulmonary/Chest: Effort normal and  breath sounds normal. No stridor. No respiratory distress. She has no rales.  Abdominal: Soft. Bowel sounds are normal. She exhibits no distension. There is no tenderness.  Musculoskeletal: Normal range of motion. She exhibits tenderness ( diffuse ttp to scalp, skin, armsa nd legs.).  Neurological: She is alert and oriented to person, place, and time.  Skin: Skin is warm and dry. No rash noted.  Psychiatric: She has a normal mood and affect. Her behavior is normal.    ED Course  Procedures (including critical care time)  DIAGNOSTIC STUDIES: Oxygen Saturation is 100% on RA, normal by my interpretation.    COORDINATION OF CARE: 6:00 PM Discussed treatment plan with pt at bedside and pt agreed to plan.   Labs Review Labs Reviewed - No data to display Imaging Review No results found.  MDM  No diagnosis found. Pain relieved with meds given, states feels much better - generalized - no more crying, non focal, VS normal, stable for d/c.  Normal CBG  Meds given in ED:  Medications - No data to display  New Prescriptions   No medications on file   I personally performed the services described in this documentation, which was scribed in my presence. The recorded information has been reviewed and is accurate.       Krystal Roller, MD 10/29/12 2010

## 2012-11-16 ENCOUNTER — Encounter: Payer: Self-pay | Admitting: Neurology

## 2012-11-19 ENCOUNTER — Institutional Professional Consult (permissible substitution): Payer: Medicare Other | Admitting: Neurology

## 2012-12-18 ENCOUNTER — Institutional Professional Consult (permissible substitution): Payer: Medicare Other | Admitting: Neurology

## 2013-01-08 ENCOUNTER — Telehealth: Payer: Self-pay | Admitting: Neurology

## 2013-01-08 NOTE — Telephone Encounter (Signed)
This patient was scheduled for a sleep consultation with Dr. Vickey Huger on 02/15/2013.  Received the patients referral information from Diane with a note from Vernona Rieger suggesting that we do not schedule the patient anymore due to numerous cancellations and rescheduling's dating back as far as 2011.  Subsequently, her 02/15/2013 appt has been canceled.  I called the referring providers office and spoke with [facility] regarding recommendation from the Engineer, manufacturing.  She was in agreement and understanding of this decision.  Please note the patient's DO NOT SCHEDULE status with our office.  Records will be kept on file for 6 months as reference documentation.

## 2013-01-28 ENCOUNTER — Other Ambulatory Visit: Payer: Self-pay | Admitting: General Surgery

## 2013-01-30 ENCOUNTER — Ambulatory Visit: Payer: Medicare Other | Admitting: Cardiology

## 2013-02-14 ENCOUNTER — Ambulatory Visit: Payer: Medicare Other | Admitting: Cardiology

## 2013-02-15 ENCOUNTER — Institutional Professional Consult (permissible substitution): Payer: Medicare Other | Admitting: Neurology

## 2014-02-27 ENCOUNTER — Other Ambulatory Visit: Payer: Self-pay | Admitting: Family Medicine

## 2014-02-27 ENCOUNTER — Other Ambulatory Visit: Payer: Medicare Other

## 2014-02-27 DIAGNOSIS — R7989 Other specified abnormal findings of blood chemistry: Secondary | ICD-10-CM

## 2014-02-27 DIAGNOSIS — R1031 Right lower quadrant pain: Secondary | ICD-10-CM

## 2014-02-27 DIAGNOSIS — D72829 Elevated white blood cell count, unspecified: Secondary | ICD-10-CM

## 2014-03-05 ENCOUNTER — Other Ambulatory Visit: Payer: Medicare Other

## 2014-03-11 ENCOUNTER — Ambulatory Visit
Admission: RE | Admit: 2014-03-11 | Discharge: 2014-03-11 | Disposition: A | Discharge status: Home / Self Care | Payer: PRIVATE HEALTH INSURANCE | Source: Ambulatory Visit | Attending: Family Medicine | Admitting: Family Medicine

## 2014-03-11 DIAGNOSIS — R7989 Other specified abnormal findings of blood chemistry: Secondary | ICD-10-CM

## 2014-03-11 DIAGNOSIS — R1031 Right lower quadrant pain: Secondary | ICD-10-CM

## 2014-03-11 DIAGNOSIS — D72829 Elevated white blood cell count, unspecified: Secondary | ICD-10-CM

## 2014-03-11 MED ORDER — IOHEXOL 300 MG/ML  SOLN
125.0000 mL | Freq: Once | INTRAMUSCULAR | Status: AC | PRN
  Administered 2014-03-11: 125 mL via INTRAVENOUS

## 2014-03-11 MED ORDER — IOHEXOL 300 MG/ML  SOLN
30.0000 mL | Freq: Once | INTRAMUSCULAR | Status: AC | PRN
  Administered 2014-03-11: 30 mL via ORAL

## 2014-04-09 ENCOUNTER — Other Ambulatory Visit (HOSPITAL_COMMUNITY)
Admission: RE | Admit: 2014-04-09 | Discharge: 2014-04-09 | Disposition: A | Discharge status: Home / Self Care | Payer: Medicare Other | Source: Ambulatory Visit | Attending: Obstetrics & Gynecology | Admitting: Obstetrics & Gynecology

## 2014-04-09 ENCOUNTER — Other Ambulatory Visit: Payer: Self-pay | Admitting: Obstetrics & Gynecology

## 2014-04-09 DIAGNOSIS — Z1151 Encounter for screening for human papillomavirus (HPV): Secondary | ICD-10-CM | POA: Insufficient documentation

## 2014-04-09 DIAGNOSIS — Z124 Encounter for screening for malignant neoplasm of cervix: Secondary | ICD-10-CM | POA: Insufficient documentation

## 2014-04-09 DIAGNOSIS — R87810 Cervical high risk human papillomavirus (HPV) DNA test positive: Secondary | ICD-10-CM | POA: Diagnosis present

## 2014-04-09 DIAGNOSIS — R87619 Unspecified abnormal cytological findings in specimens from cervix uteri: Secondary | ICD-10-CM | POA: Insufficient documentation

## 2014-04-15 LAB — CYTOLOGY - PAP

## 2014-05-02 ENCOUNTER — Other Ambulatory Visit: Payer: Self-pay | Admitting: Obstetrics & Gynecology

## 2015-03-24 ENCOUNTER — Emergency Department (HOSPITAL_COMMUNITY): Payer: Medicare Other

## 2015-03-24 ENCOUNTER — Emergency Department (HOSPITAL_COMMUNITY)
Admission: EM | Admit: 2015-03-24 | Discharge: 2015-03-25 | Disposition: A | Discharge status: Home / Self Care | Payer: Medicare Other | Attending: Emergency Medicine | Admitting: Emergency Medicine

## 2015-03-24 ENCOUNTER — Encounter (HOSPITAL_COMMUNITY): Payer: Self-pay

## 2015-03-24 DIAGNOSIS — F1310 Sedative, hypnotic or anxiolytic abuse, uncomplicated: Secondary | ICD-10-CM | POA: Insufficient documentation

## 2015-03-24 DIAGNOSIS — R4781 Slurred speech: Secondary | ICD-10-CM | POA: Insufficient documentation

## 2015-03-24 DIAGNOSIS — R4701 Aphasia: Secondary | ICD-10-CM | POA: Diagnosis present

## 2015-03-24 DIAGNOSIS — G43909 Migraine, unspecified, not intractable, without status migrainosus: Secondary | ICD-10-CM | POA: Diagnosis not present

## 2015-03-24 DIAGNOSIS — Z87891 Personal history of nicotine dependence: Secondary | ICD-10-CM | POA: Insufficient documentation

## 2015-03-24 DIAGNOSIS — Z79899 Other long term (current) drug therapy: Secondary | ICD-10-CM | POA: Insufficient documentation

## 2015-03-24 DIAGNOSIS — F3181 Bipolar II disorder: Secondary | ICD-10-CM | POA: Insufficient documentation

## 2015-03-24 DIAGNOSIS — Z91040 Latex allergy status: Secondary | ICD-10-CM | POA: Diagnosis not present

## 2015-03-24 DIAGNOSIS — R443 Hallucinations, unspecified: Secondary | ICD-10-CM | POA: Insufficient documentation

## 2015-03-24 DIAGNOSIS — Z8669 Personal history of other diseases of the nervous system and sense organs: Secondary | ICD-10-CM | POA: Diagnosis not present

## 2015-03-24 LAB — CBC WITH DIFFERENTIAL/PLATELET
Basophils Absolute: 0 K/uL (ref 0.0–0.1)
Basophils Relative: 0 %
Eosinophils Absolute: 0.3 K/uL (ref 0.0–0.7)
Eosinophils Relative: 3 %
HCT: 38.3 % (ref 36.0–46.0)
Hemoglobin: 12.2 g/dL (ref 12.0–15.0)
Lymphocytes Relative: 31 %
Lymphs Abs: 2.6 K/uL (ref 0.7–4.0)
MCH: 27.4 pg (ref 26.0–34.0)
MCHC: 31.9 g/dL (ref 30.0–36.0)
MCV: 86.1 fL (ref 78.0–100.0)
Monocytes Absolute: 0.6 K/uL (ref 0.1–1.0)
Monocytes Relative: 7 %
Neutro Abs: 4.9 K/uL (ref 1.7–7.7)
Neutrophils Relative %: 59 %
Platelets: 214 K/uL (ref 150–400)
RBC: 4.45 MIL/uL (ref 3.87–5.11)
RDW: 14.1 % (ref 11.5–15.5)
WBC: 8.3 K/uL (ref 4.0–10.5)

## 2015-03-24 NOTE — ED Notes (Signed)
Pt complains of slurred speech due to insomnia and today her husband noticed her face wasn't reacting to his voice appropriately, she also states she was hallucinating and talking to people that aren't there

## 2015-03-24 NOTE — ED Provider Notes (Signed)
CSN: [id number]     Arrival date & time 03/24/15  2148 History  By signing my name below, I, Soijett Blue, attest that this documentation has been prepared under the direction and in the presence of Veryl Speak, MD. Electronically Signed: Soijett Blue, ED Scribe. 03/24/2015. 11:14 PM.   Chief Complaint  Patient presents with  . Aphasia      The history is provided by the patient and the spouse. No language interpreter was used.    Krystal French is a 40 y.o. female with a medical hx of bipolar 2 disorder, schizoaffective disorder, fibromyalgia, and polycystic ovaries, who presents to the Emergency Department complaining of aphasia onset today. Pt reports that she had intermittent moments of not being coherent x 4 days. She states that today she was having auditory/visual hallucinations with people who weren't present and she is unsure of who she was talking to. She notes that she has had visual hallucinations in the past but she has never spoken to the hallucinations before or had episodes like her current one. Husband states that the pt had right sided facial drooping today and that is what prompted the visit to the ED tonight.   She reports that her opana dosage was increased as her breakthrough pain medication and that is new for her. She states that since the onset of her symptoms 4 days ago, she stopped taking the opana thinking that was the cause of her episodes. She reports that she takes opana, fentanyl patch, and other numerous psych medications that are Rx at [facility]. Pt has an appointment in 2 days with her psychiatrist. Pt is having associated symptoms of right sided weakness. She notes that she has not tried any medications for the relief of her symptoms. She denies SI/HI and any other symptoms. Pt denies a PMHx of DM or HTN.   Past Medical History  Diagnosis Date  . Bipolar 2 disorder ([place])   . Migraines   . Polycystic ovaries   . Migraine   . Morbid obesity ([place])   .  Sleep apnea   . Migraines    Past Surgical History  Procedure Laterality Date  . Cholecystectomy    . Wisdom tooth extraction     Family History  Problem Relation Age of Onset  . Sleep apnea Father    Social History  Substance Use Topics  . Smoking status: Former Research scientist (life sciences)  . Smokeless tobacco: None     Comment: quit in 2000  . Alcohol Use: No     Comment: 3-4 times yearly   OB History    No data available     Review of Systems  A complete 10 system review of systems was obtained and all systems are negative except as noted in the HPI and PMH.   Allergies  Codeine; Morphine and related; Latex; and Sulfa antibiotics  Home Medications   Prior to Admission medications   Medication Sig Start Date End Date Taking? Authorizing Provider  clorazepate (TRANXENE) 15 MG tablet Take 15 mg by mouth daily as needed for anxiety.   Yes Historical Provider, MD  divalproex (DEPAKOTE) 500 MG DR tablet Take 500 mg by mouth 2 (two) times daily. Take 1 tab (500 mg) in the am and Take 2 tabs (1000 mg) at bedtime   Yes Historical Provider, MD  doxepin (SINEQUAN) 75 MG capsule Take 75-150 mg by mouth at bedtime as needed (sleep).   Yes Historical Provider, MD  fentaNYL (DURAGESIC - DOSED MCG/HR) 12 MCG/HR  Place 12.5 mcg onto the skin every other day.   Yes Historical Provider, MD  fentaNYL (DURAGESIC - DOSED MCG/HR) 25 MCG/HR patch Place 25 mcg onto the skin every other day.   Yes Historical Provider, MD  fish oil-omega-3 fatty acids 1000 MG capsule Take 1 g by mouth daily.   Yes Historical Provider, MD  folic acid (FOLVITE) 1 MG tablet Take 1 mg by mouth daily.   Yes Historical Provider, MD  lamoTRIgine (LAMICTAL) 150 MG tablet Take 150 mg by mouth at bedtime.   Yes Historical Provider, MD  ondansetron (ZOFRAN) 4 MG tablet Take 4 mg by mouth every 8 (eight) hours as needed for nausea.   Yes Historical Provider, MD  oxymorphone (OPANA) 10 MG tablet Take 10 mg by mouth every 6 (six) hours as needed  for pain.   Yes Historical Provider, MD  topiramate (TOPAMAX) 200 MG tablet Take 200 mg by mouth at bedtime.   Yes Historical Provider, MD  venlafaxine XR (EFFEXOR-XR) 150 MG 24 hr capsule Take 300 mg by mouth at bedtime.   Yes Historical Provider, MD  naproxen (NAPROSYN) 500 MG tablet Take 1 tablet (500 mg total) by mouth 2 (two) times daily with a meal. Patient not taking: Reported on 03/24/2015 10/29/12   Noemi Chapel, MD   BP 139/66 mmHg  Pulse 80  Temp(Src) 97.9 F (36.6 C) (Oral)  Resp 18  SpO2 99%  LMP 02/13/2015 Physical Exam  Constitutional: She is oriented to person, place, and time. She appears well-developed and well-nourished. No distress.  HENT:  Head: Normocephalic and atraumatic.  Mouth/Throat: Oropharynx is clear and moist. No oropharyngeal exudate.  Eyes: Conjunctivae and EOM are normal. Pupils are equal, round, and reactive to light.  Neck: Neck supple.  Cardiovascular: Normal rate, regular rhythm and normal heart sounds.  Exam reveals no gallop and no friction rub.   No murmur heard. Pulmonary/Chest: Effort normal and breath sounds normal. No respiratory distress. She has no wheezes. She has no rales.  Abdominal: Soft. There is no tenderness.  Musculoskeletal: Normal range of motion.  Neurological: She is alert and oriented to person, place, and time. She has normal strength. No cranial nerve deficit or sensory deficit. Coordination normal.  Skin: Skin is warm and dry.  Psychiatric: She has a normal mood and affect. Her behavior is normal.  Nursing note and vitals reviewed.   ED Course  Procedures (including critical care time) DIAGNOSTIC STUDIES: Oxygen Saturation is 99% on RA, nl by my interpretation.    COORDINATION OF CARE: 11:11 PM Discussed treatment plan with pt at bedside which includes EKG and pt agreed to plan.    Labs Review Labs Reviewed  BASIC METABOLIC PANEL - Abnormal; Notable for the following:    Glucose, Bld 101 (*)    Calcium 8.8 (*)     All other components within normal limits  URINE RAPID DRUG SCREEN, HOSP PERFORMED - Abnormal; Notable for the following:    Benzodiazepines POSITIVE (*)    All other components within normal limits  VALPROIC ACID LEVEL - Abnormal; Notable for the following:    Valproic Acid Lvl 35 (*)    All other components within normal limits  CBC WITH DIFFERENTIAL/PLATELET  ETHANOL    Imaging Review Ct Head Wo Contrast  03/24/2015  CLINICAL DATA:  New onset aphasia today.  Initial encounter. EXAM: CT HEAD WITHOUT CONTRAST TECHNIQUE: Contiguous axial images were obtained from the base of the skull through the vertex without intravenous contrast. COMPARISON:  Head CT scan 03/28/2006. FINDINGS: There is no evidence of acute intracranial abnormality including hemorrhage, infarct, mass lesion, mass effect, midline shift or abnormal extra-axial fluid collection. No hydrocephalus or pneumocephalus. The calvarium is intact. Imaged paranasal sinuses and mastoid air cells are clear. IMPRESSION: Negative head CT. Electronically Signed   By: Inge Rise M.D.   On: 03/24/2015 23:52   I have personally reviewed and evaluated these images and lab results as part of my medical decision-making.   EKG Interpretation None      MDM   Final diagnoses:  None    Patient presents here with complaints of right facial droop and hallucinations. She has an extensive psychiatric history as well as chronic pain related to fibromyalgia. She is on multiple pain medications and psychiatric medications.  Her workup today reveals no evidence for stroke or other laboratory abnormality. Her Depakote level is subtherapeutic, however she is due for a dose here soon. I see no indication for admission. She does have a follow-up appointment in 2 days with her psychiatrist. My advice to her is to discuss her medication regimen with her psychiatrist. I suspect her symptoms may be related to her medications.  I personally performed  the services described in this documentation, which was scribed in my presence. The recorded information has been reviewed and is accurate.        Veryl Speak, MD 03/25/15 [phone]

## 2015-03-25 LAB — BASIC METABOLIC PANEL
Anion gap: 8 (ref 5–15)
BUN: 11 mg/dL (ref 6–20)
CALCIUM: 8.8 mg/dL — AB (ref 8.9–10.3)
CHLORIDE: 104 mmol/L (ref 101–111)
CO2: 25 mmol/L (ref 22–32)
CREATININE: 0.95 mg/dL (ref 0.44–1.00)
GFR calc non Af Amer: >60 mL/min (ref >60)
Glucose, Bld: 101 mg/dL — ABNORMAL HIGH (ref 65–99)
Potassium: 4.7 mmol/L (ref 3.5–5.1)
Sodium: 137 mmol/L (ref 135–145)

## 2015-03-25 LAB — RAPID URINE DRUG SCREEN, HOSP PERFORMED
Amphetamines: NOT DETECTED
BARBITURATES: NOT DETECTED
Benzodiazepines: POSITIVE — AB
COCAINE: NOT DETECTED
Opiates: NOT DETECTED
TETRAHYDROCANNABINOL: NOT DETECTED

## 2015-03-25 LAB — VALPROIC ACID LEVEL: VALPROIC ACID LVL: 35 ug/mL — AB (ref 50.0–100.0)

## 2015-03-25 LAB — ETHANOL

## 2015-03-25 NOTE — Discharge Instructions (Signed)
Continue your medications as before.  Follow-up with your psychiatrist as scheduled on Thursday to discuss your medications, and return to the ER if your symptoms significantly worsen or change.

## 2015-05-22 ENCOUNTER — Ambulatory Visit: Payer: Self-pay

## 2015-05-22 ENCOUNTER — Encounter: Payer: Medicare Other | Admitting: Podiatry

## 2015-05-29 NOTE — Progress Notes (Signed)
This encounter was created in error - please disregard.

## 2015-06-03 ENCOUNTER — Encounter: Payer: Self-pay | Admitting: Podiatry

## 2015-06-03 ENCOUNTER — Ambulatory Visit (INDEPENDENT_AMBULATORY_CARE_PROVIDER_SITE_OTHER): Payer: Medicare Other | Admitting: Podiatry

## 2015-06-03 ENCOUNTER — Ambulatory Visit (INDEPENDENT_AMBULATORY_CARE_PROVIDER_SITE_OTHER): Payer: Medicare Other

## 2015-06-03 VITALS — BP 121/83 | HR 105 | Resp 16

## 2015-06-03 DIAGNOSIS — M722 Plantar fascial fibromatosis: Secondary | ICD-10-CM | POA: Diagnosis not present

## 2015-06-03 MED ORDER — TRIAMCINOLONE ACETONIDE 10 MG/ML IJ SUSP
10.0000 mg | Freq: Once | INTRAMUSCULAR | Status: AC
  Administered 2015-06-03: 10 mg

## 2015-06-03 NOTE — Patient Instructions (Signed)

## 2015-06-03 NOTE — Progress Notes (Signed)
Subjective:     Patient ID: Krystal French, female   DOB: Feb 09, 1976, 40 y.o.   MRN: [id number]  HPI patient presents with a lot of pain under her right heel stating that it's been bothering her for the last few weeks and she does not remember specific injury. She is quite obese which is a complicating factor   Review of Systems  All other systems reviewed and are negative.      Objective:   Physical Exam  Constitutional: She is oriented to person, place, and time.  Cardiovascular: Intact distal pulses.   Musculoskeletal: Normal range of motion.  Neurological: She is oriented to person, place, and time.  Skin: Skin is warm.  Nursing note and vitals reviewed.  neurovascular status intact muscle strength adequate range of motion within normal limits with patient found to have exquisite discomfort in the plantar aspect of the right heel and also some small nodules on the medial side of the right heel which become sore. Patient has good digital perfusion is well oriented 3 with no depression of the arch noted currently     Assessment:     Acute plantar fasciitis with small little inflammatory areas on the medial side but I don't believe are contributory to problem    Plan:     H&P and x-rays reviewed with patient. Today I went ahead and I injected the plantar fascial right 3 mg Kenalog 5 mg Xylocaine and applied fascial brace with instructions on usage. Patient will be seen back and was given instructions on physical therapy  X-ray report indicated spur formation with no indications of stress fracture or arthritis

## 2015-06-03 NOTE — Progress Notes (Signed)
   Subjective:    Patient ID: Krystal French, female    DOB: 09/08/1975, 40 y.o.   MRN: [id number]  HPI  Pt presents with medial side arch/ heel pain lasting 2 weeks.Nodule has becme incresingly painful  Review of Systems  Gastrointestinal: Positive for nausea and constipation.  Endocrine: Positive for heat intolerance and polydipsia.  Musculoskeletal: Positive for back pain, arthralgias and gait problem.  Allergic/Immunologic: Positive for food allergies.  Neurological: Positive for dizziness, light-headedness, numbness and headaches.  All other systems reviewed and are negative.      Objective:   Physical Exam        Assessment & Plan:

## 2015-06-17 ENCOUNTER — Ambulatory Visit: Payer: Medicare Other | Admitting: Podiatry

## 2015-06-24 ENCOUNTER — Ambulatory Visit: Payer: Medicare Other | Admitting: Podiatry

## 2016-02-09 IMAGING — CT CT HEAD W/O CM
2 series · 16 of 30 positions shown, 20 images · non-contrast
Comparison: Head CT scan 03/28/2006.

CLINICAL DATA: New onset aphasia today.  Initial encounter.

EXAM:
CT HEAD WITHOUT CONTRAST
TECHNIQUE: Contiguous axial images were obtained from the base of the skull
through the vertex without intravenous contrast.

[Series 2: head w/o · axial · non-contrast · 0.46mm/px · z∈[-74,+56]mm · 13 of 32 slices shown, 17 images]
[im 3/32  brain]
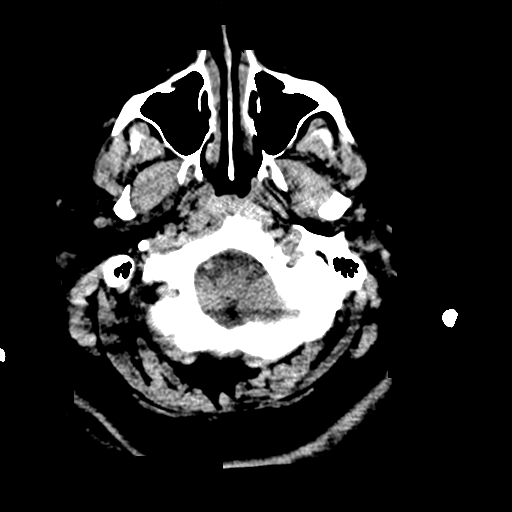
[im 3/32  bone]
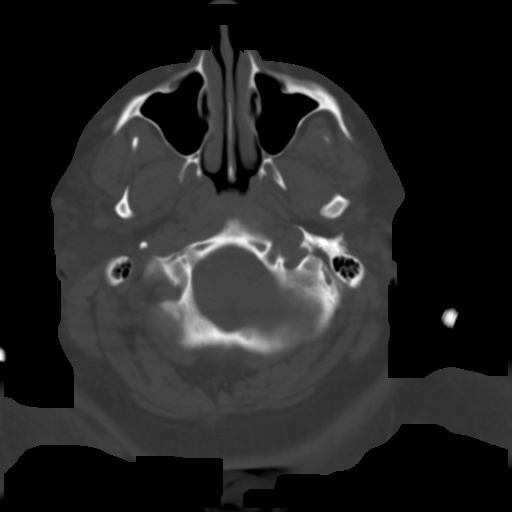
[im 5/32  brain]
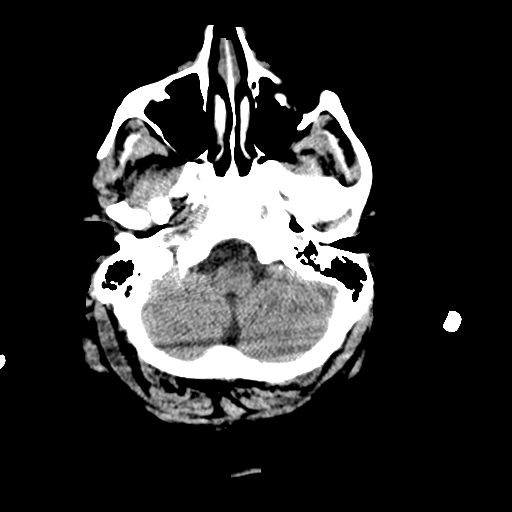
[im 7/32  brain]
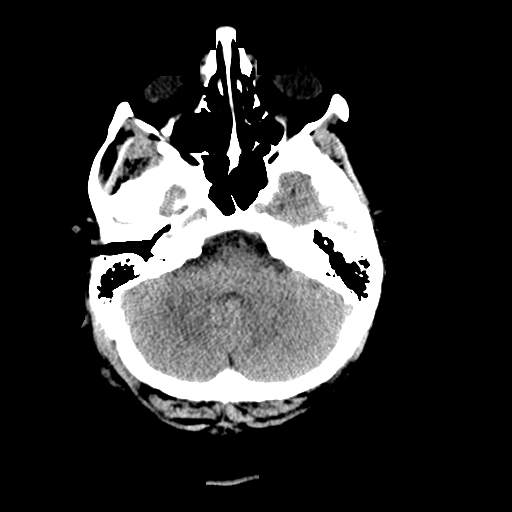
[im 9/32  brain]
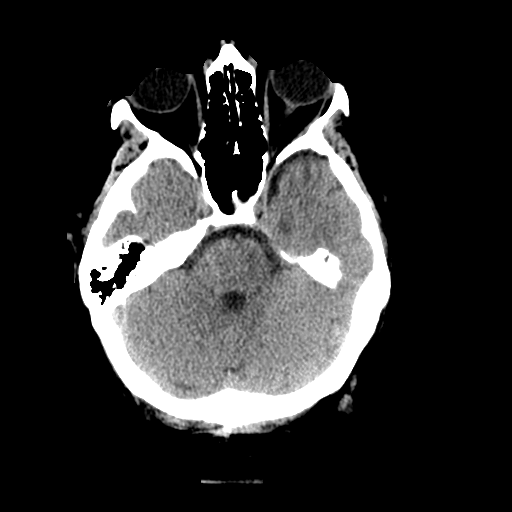
[im 12/32  brain]
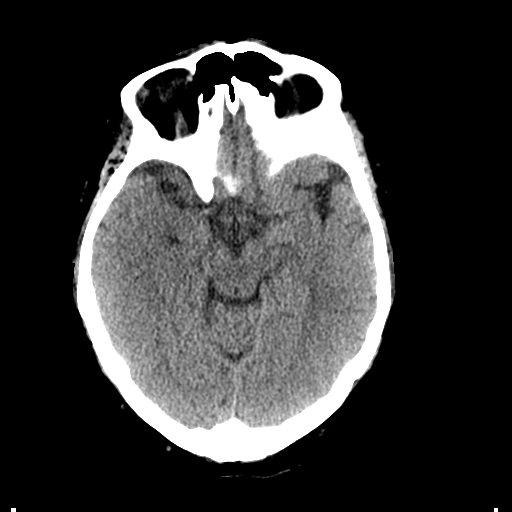
[im 12/32  bone]
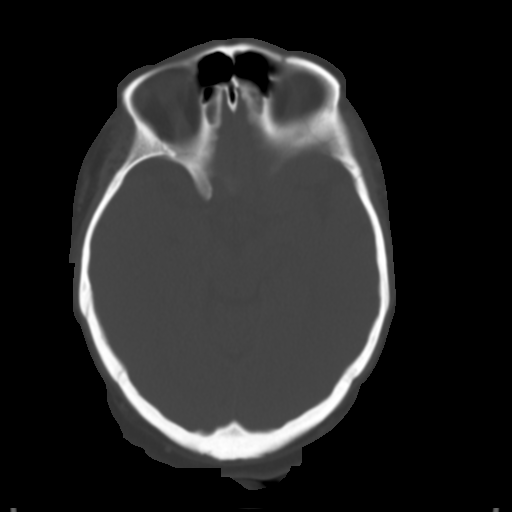
[im 14/32  brain]
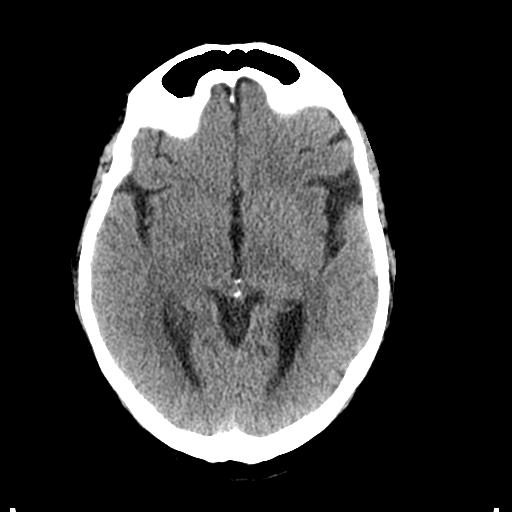
[im 16/32  brain]
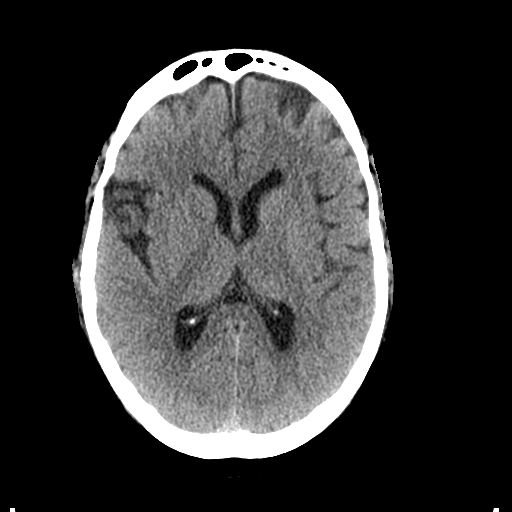
[im 18/32  brain]
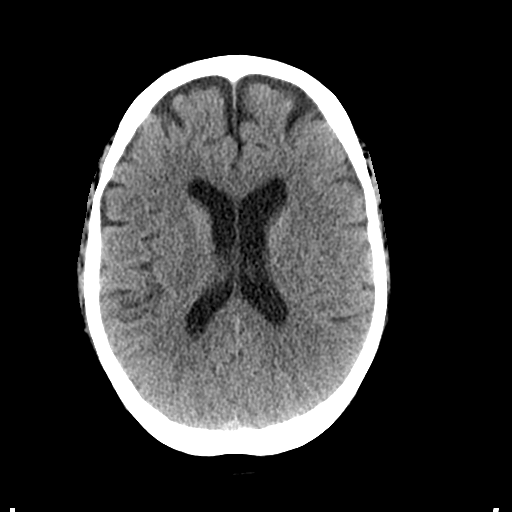
[im 20/32  brain]
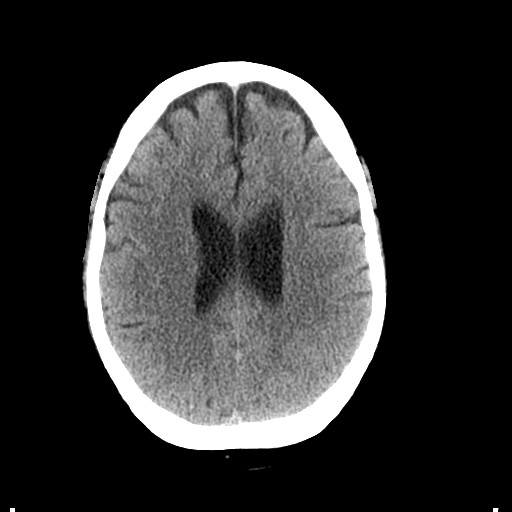
[im 20/32  bone]
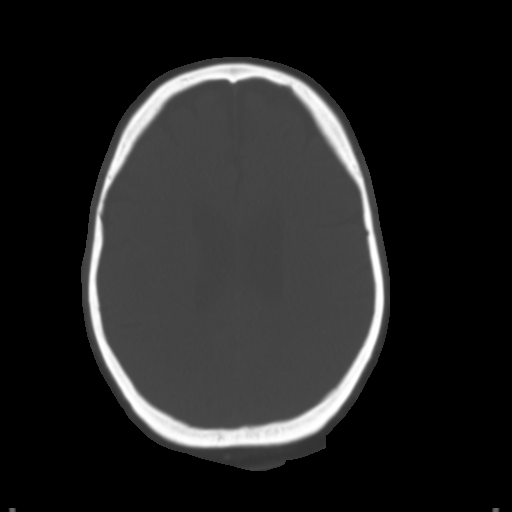
[im 23/32  brain]
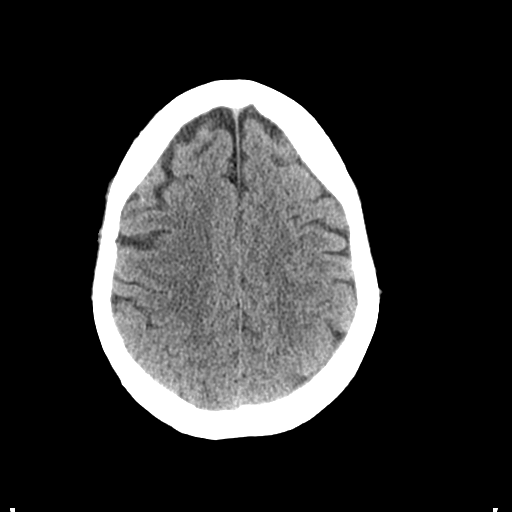
[im 25/32  brain]
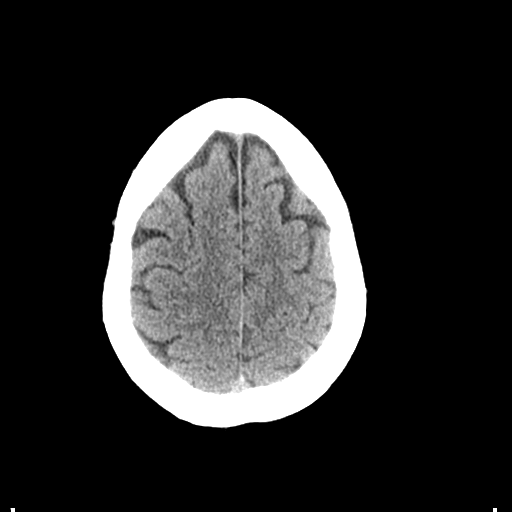
[im 27/32  brain]
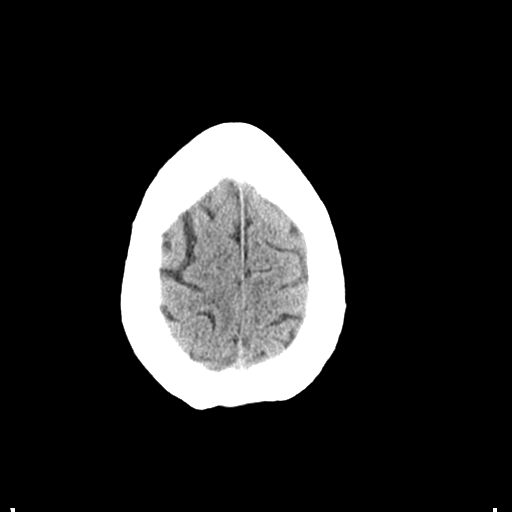
[im 29/32  brain]
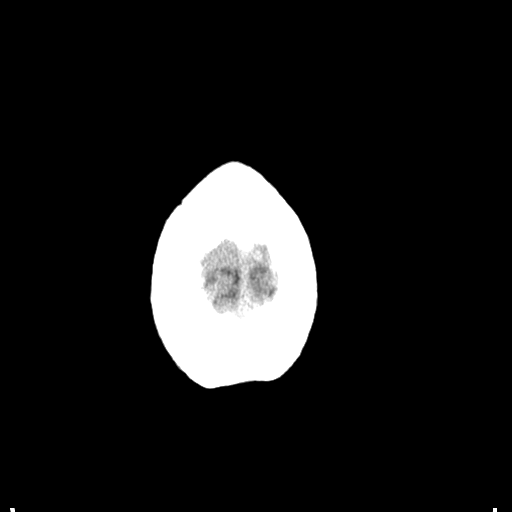
[im 29/32  bone]
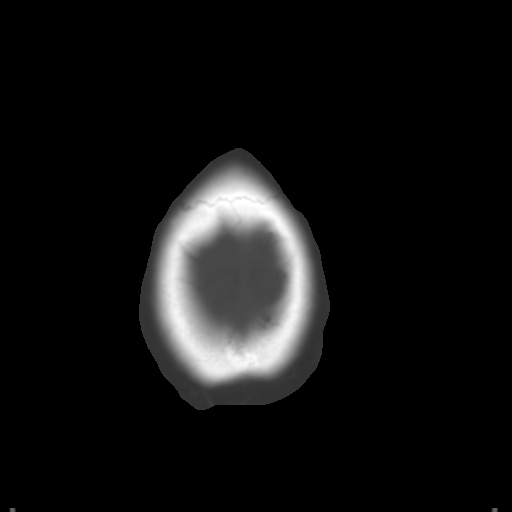

[Series 3: bone windows · axial · 0.46mm/px · z∈[-74,-29]mm · 3 of 32 slices shown]
[im 3/32  bone]
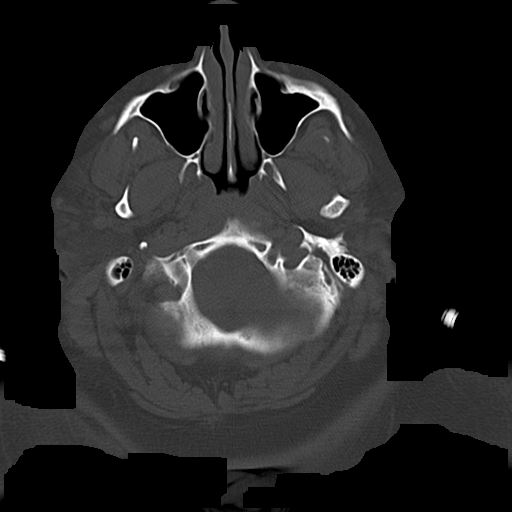
[im 7/32  bone]
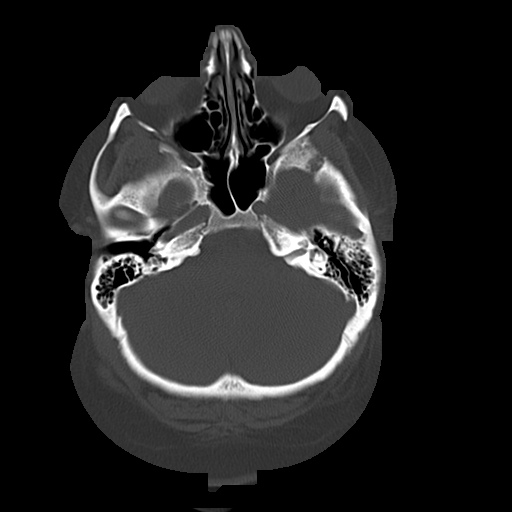
[im 12/32  bone]
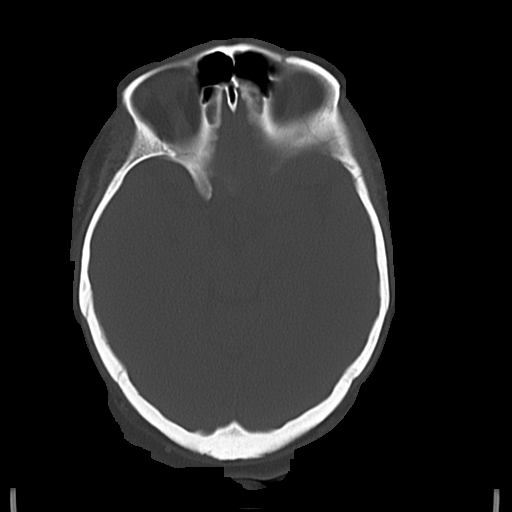

[16 of 30 positions shown; findings below may reference images not displayed]

FINDINGS: There is no evidence of acute intracranial abnormality including
hemorrhage, infarct, mass lesion, mass effect, midline shift or
abnormal extra-axial fluid collection. No hydrocephalus or
pneumocephalus. The calvarium is intact. Imaged paranasal sinuses
and mastoid air cells are clear.
IMPRESSION: Negative head CT.

## 2016-03-02 DIAGNOSIS — G4733 Obstructive sleep apnea (adult) (pediatric): Secondary | ICD-10-CM | POA: Diagnosis not present

## 2016-03-26 DIAGNOSIS — F314 Bipolar disorder, current episode depressed, severe, without psychotic features: Secondary | ICD-10-CM | POA: Diagnosis not present

## 2016-04-11 DIAGNOSIS — Z79891 Long term (current) use of opiate analgesic: Secondary | ICD-10-CM | POA: Diagnosis not present

## 2016-04-11 DIAGNOSIS — M25561 Pain in right knee: Secondary | ICD-10-CM | POA: Diagnosis not present

## 2016-04-11 DIAGNOSIS — G894 Chronic pain syndrome: Secondary | ICD-10-CM | POA: Diagnosis not present

## 2016-04-11 DIAGNOSIS — M797 Fibromyalgia: Secondary | ICD-10-CM | POA: Diagnosis not present

## 2016-04-21 DIAGNOSIS — F3130 Bipolar disorder, current episode depressed, mild or moderate severity, unspecified: Secondary | ICD-10-CM | POA: Diagnosis not present

## 2016-06-23 DIAGNOSIS — J0100 Acute maxillary sinusitis, unspecified: Secondary | ICD-10-CM | POA: Diagnosis not present

## 2016-06-30 DIAGNOSIS — G4733 Obstructive sleep apnea (adult) (pediatric): Secondary | ICD-10-CM | POA: Diagnosis not present

## 2016-07-05 DIAGNOSIS — N760 Acute vaginitis: Secondary | ICD-10-CM | POA: Diagnosis not present

## 2016-07-05 DIAGNOSIS — R7302 Impaired glucose tolerance (oral): Secondary | ICD-10-CM | POA: Diagnosis not present

## 2016-07-05 DIAGNOSIS — E282 Polycystic ovarian syndrome: Secondary | ICD-10-CM | POA: Diagnosis not present

## 2016-07-05 DIAGNOSIS — M25562 Pain in left knee: Secondary | ICD-10-CM | POA: Diagnosis not present

## 2016-07-05 DIAGNOSIS — R635 Abnormal weight gain: Secondary | ICD-10-CM | POA: Diagnosis not present

## 2016-07-05 DIAGNOSIS — R05 Cough: Secondary | ICD-10-CM | POA: Diagnosis not present

## 2016-07-14 DIAGNOSIS — M797 Fibromyalgia: Secondary | ICD-10-CM | POA: Diagnosis not present

## 2016-07-14 DIAGNOSIS — Z5181 Encounter for therapeutic drug level monitoring: Secondary | ICD-10-CM | POA: Diagnosis not present

## 2016-07-14 DIAGNOSIS — Z79891 Long term (current) use of opiate analgesic: Secondary | ICD-10-CM | POA: Diagnosis not present

## 2016-07-14 DIAGNOSIS — G894 Chronic pain syndrome: Secondary | ICD-10-CM | POA: Diagnosis not present

## 2016-07-14 DIAGNOSIS — M25561 Pain in right knee: Secondary | ICD-10-CM | POA: Diagnosis not present

## 2016-07-20 DIAGNOSIS — F324 Major depressive disorder, single episode, in partial remission: Secondary | ICD-10-CM | POA: Diagnosis not present

## 2016-07-22 DIAGNOSIS — M25562 Pain in left knee: Secondary | ICD-10-CM | POA: Diagnosis not present

## 2016-07-22 DIAGNOSIS — M25662 Stiffness of left knee, not elsewhere classified: Secondary | ICD-10-CM | POA: Diagnosis not present

## 2016-07-22 DIAGNOSIS — M25661 Stiffness of right knee, not elsewhere classified: Secondary | ICD-10-CM | POA: Diagnosis not present

## 2016-07-22 DIAGNOSIS — M25561 Pain in right knee: Secondary | ICD-10-CM | POA: Diagnosis not present

## 2016-08-01 DIAGNOSIS — G4733 Obstructive sleep apnea (adult) (pediatric): Secondary | ICD-10-CM | POA: Diagnosis not present

## 2016-08-03 DIAGNOSIS — M25662 Stiffness of left knee, not elsewhere classified: Secondary | ICD-10-CM | POA: Diagnosis not present

## 2016-08-03 DIAGNOSIS — M25562 Pain in left knee: Secondary | ICD-10-CM | POA: Diagnosis not present

## 2016-08-03 DIAGNOSIS — M25561 Pain in right knee: Secondary | ICD-10-CM | POA: Diagnosis not present

## 2016-08-03 DIAGNOSIS — M25661 Stiffness of right knee, not elsewhere classified: Secondary | ICD-10-CM | POA: Diagnosis not present

## 2016-08-05 DIAGNOSIS — M25661 Stiffness of right knee, not elsewhere classified: Secondary | ICD-10-CM | POA: Diagnosis not present

## 2016-08-05 DIAGNOSIS — M25561 Pain in right knee: Secondary | ICD-10-CM | POA: Diagnosis not present

## 2016-08-05 DIAGNOSIS — M25562 Pain in left knee: Secondary | ICD-10-CM | POA: Diagnosis not present

## 2016-08-05 DIAGNOSIS — M25662 Stiffness of left knee, not elsewhere classified: Secondary | ICD-10-CM | POA: Diagnosis not present

## 2016-08-08 DIAGNOSIS — M25561 Pain in right knee: Secondary | ICD-10-CM | POA: Diagnosis not present

## 2016-08-08 DIAGNOSIS — M25661 Stiffness of right knee, not elsewhere classified: Secondary | ICD-10-CM | POA: Diagnosis not present

## 2016-08-08 DIAGNOSIS — M25562 Pain in left knee: Secondary | ICD-10-CM | POA: Diagnosis not present

## 2016-08-08 DIAGNOSIS — M25662 Stiffness of left knee, not elsewhere classified: Secondary | ICD-10-CM | POA: Diagnosis not present

## 2016-08-10 DIAGNOSIS — M25561 Pain in right knee: Secondary | ICD-10-CM | POA: Diagnosis not present

## 2016-08-10 DIAGNOSIS — M25662 Stiffness of left knee, not elsewhere classified: Secondary | ICD-10-CM | POA: Diagnosis not present

## 2016-08-10 DIAGNOSIS — M25562 Pain in left knee: Secondary | ICD-10-CM | POA: Diagnosis not present

## 2016-08-10 DIAGNOSIS — M25661 Stiffness of right knee, not elsewhere classified: Secondary | ICD-10-CM | POA: Diagnosis not present

## 2016-08-15 DIAGNOSIS — M25561 Pain in right knee: Secondary | ICD-10-CM | POA: Diagnosis not present

## 2016-08-15 DIAGNOSIS — M25562 Pain in left knee: Secondary | ICD-10-CM | POA: Diagnosis not present

## 2016-08-15 DIAGNOSIS — M25662 Stiffness of left knee, not elsewhere classified: Secondary | ICD-10-CM | POA: Diagnosis not present

## 2016-08-15 DIAGNOSIS — M25661 Stiffness of right knee, not elsewhere classified: Secondary | ICD-10-CM | POA: Diagnosis not present

## 2016-08-22 DIAGNOSIS — M25562 Pain in left knee: Secondary | ICD-10-CM | POA: Diagnosis not present

## 2016-08-22 DIAGNOSIS — M25561 Pain in right knee: Secondary | ICD-10-CM | POA: Diagnosis not present

## 2016-08-22 DIAGNOSIS — M25662 Stiffness of left knee, not elsewhere classified: Secondary | ICD-10-CM | POA: Diagnosis not present

## 2016-08-22 DIAGNOSIS — M25661 Stiffness of right knee, not elsewhere classified: Secondary | ICD-10-CM | POA: Diagnosis not present

## 2016-09-01 DIAGNOSIS — G4733 Obstructive sleep apnea (adult) (pediatric): Secondary | ICD-10-CM | POA: Diagnosis not present

## 2016-10-06 DIAGNOSIS — Z79891 Long term (current) use of opiate analgesic: Secondary | ICD-10-CM | POA: Diagnosis not present

## 2016-10-06 DIAGNOSIS — G894 Chronic pain syndrome: Secondary | ICD-10-CM | POA: Diagnosis not present

## 2016-10-06 DIAGNOSIS — G4733 Obstructive sleep apnea (adult) (pediatric): Secondary | ICD-10-CM | POA: Diagnosis not present

## 2016-10-06 DIAGNOSIS — M797 Fibromyalgia: Secondary | ICD-10-CM | POA: Diagnosis not present

## 2016-10-06 DIAGNOSIS — M25561 Pain in right knee: Secondary | ICD-10-CM | POA: Diagnosis not present

## 2016-11-07 DIAGNOSIS — G4733 Obstructive sleep apnea (adult) (pediatric): Secondary | ICD-10-CM | POA: Diagnosis not present

## 2016-12-07 DIAGNOSIS — F314 Bipolar disorder, current episode depressed, severe, without psychotic features: Secondary | ICD-10-CM | POA: Diagnosis not present

## 2016-12-08 DIAGNOSIS — G4733 Obstructive sleep apnea (adult) (pediatric): Secondary | ICD-10-CM | POA: Diagnosis not present

## 2017-01-10 DIAGNOSIS — G4733 Obstructive sleep apnea (adult) (pediatric): Secondary | ICD-10-CM | POA: Diagnosis not present

## 2017-02-03 DIAGNOSIS — Z5181 Encounter for therapeutic drug level monitoring: Secondary | ICD-10-CM | POA: Diagnosis not present

## 2017-02-03 DIAGNOSIS — Z79891 Long term (current) use of opiate analgesic: Secondary | ICD-10-CM | POA: Diagnosis not present

## 2017-02-03 DIAGNOSIS — M797 Fibromyalgia: Secondary | ICD-10-CM | POA: Diagnosis not present

## 2017-02-03 DIAGNOSIS — G894 Chronic pain syndrome: Secondary | ICD-10-CM | POA: Diagnosis not present

## 2017-02-03 DIAGNOSIS — M25561 Pain in right knee: Secondary | ICD-10-CM | POA: Diagnosis not present

## 2017-02-10 DIAGNOSIS — G4733 Obstructive sleep apnea (adult) (pediatric): Secondary | ICD-10-CM | POA: Diagnosis not present

## 2017-03-17 DIAGNOSIS — G4733 Obstructive sleep apnea (adult) (pediatric): Secondary | ICD-10-CM | POA: Diagnosis not present

## 2017-04-05 ENCOUNTER — Other Ambulatory Visit (HOSPITAL_COMMUNITY): Payer: Self-pay | Admitting: Psychiatry

## 2017-04-07 ENCOUNTER — Other Ambulatory Visit (HOSPITAL_COMMUNITY): Payer: Self-pay | Admitting: Psychiatry

## 2017-04-19 DIAGNOSIS — G4733 Obstructive sleep apnea (adult) (pediatric): Secondary | ICD-10-CM | POA: Diagnosis not present

## 2017-04-27 DIAGNOSIS — G894 Chronic pain syndrome: Secondary | ICD-10-CM | POA: Diagnosis not present

## 2017-04-27 DIAGNOSIS — M25561 Pain in right knee: Secondary | ICD-10-CM | POA: Diagnosis not present

## 2017-04-27 DIAGNOSIS — Z79891 Long term (current) use of opiate analgesic: Secondary | ICD-10-CM | POA: Diagnosis not present

## 2017-04-27 DIAGNOSIS — M797 Fibromyalgia: Secondary | ICD-10-CM | POA: Diagnosis not present

## 2017-05-02 DIAGNOSIS — L280 Lichen simplex chronicus: Secondary | ICD-10-CM | POA: Diagnosis not present

## 2017-05-02 DIAGNOSIS — L720 Epidermal cyst: Secondary | ICD-10-CM | POA: Diagnosis not present

## 2017-05-02 DIAGNOSIS — L700 Acne vulgaris: Secondary | ICD-10-CM | POA: Diagnosis not present

## 2017-05-02 DIAGNOSIS — Z23 Encounter for immunization: Secondary | ICD-10-CM | POA: Diagnosis not present

## 2017-05-02 DIAGNOSIS — D179 Benign lipomatous neoplasm, unspecified: Secondary | ICD-10-CM | POA: Diagnosis not present

## 2017-05-02 DIAGNOSIS — D1801 Hemangioma of skin and subcutaneous tissue: Secondary | ICD-10-CM | POA: Diagnosis not present

## 2017-05-02 DIAGNOSIS — L821 Other seborrheic keratosis: Secondary | ICD-10-CM | POA: Diagnosis not present

## 2017-05-02 DIAGNOSIS — D2239 Melanocytic nevi of other parts of face: Secondary | ICD-10-CM | POA: Diagnosis not present

## 2017-05-02 DIAGNOSIS — D225 Melanocytic nevi of trunk: Secondary | ICD-10-CM | POA: Diagnosis not present

## 2017-05-04 DIAGNOSIS — F3131 Bipolar disorder, current episode depressed, mild: Secondary | ICD-10-CM | POA: Diagnosis not present

## 2017-06-06 DIAGNOSIS — G4733 Obstructive sleep apnea (adult) (pediatric): Secondary | ICD-10-CM | POA: Diagnosis not present

## 2017-06-14 DIAGNOSIS — F3131 Bipolar disorder, current episode depressed, mild: Secondary | ICD-10-CM | POA: Diagnosis not present

## 2017-07-12 DIAGNOSIS — G4733 Obstructive sleep apnea (adult) (pediatric): Secondary | ICD-10-CM | POA: Diagnosis not present

## 2017-07-14 DIAGNOSIS — Z5181 Encounter for therapeutic drug level monitoring: Secondary | ICD-10-CM | POA: Diagnosis not present

## 2017-07-14 DIAGNOSIS — G894 Chronic pain syndrome: Secondary | ICD-10-CM | POA: Diagnosis not present

## 2017-07-14 DIAGNOSIS — M797 Fibromyalgia: Secondary | ICD-10-CM | POA: Diagnosis not present

## 2017-07-14 DIAGNOSIS — M25561 Pain in right knee: Secondary | ICD-10-CM | POA: Diagnosis not present

## 2017-07-14 DIAGNOSIS — Z79891 Long term (current) use of opiate analgesic: Secondary | ICD-10-CM | POA: Diagnosis not present

## 2017-08-11 DIAGNOSIS — G4733 Obstructive sleep apnea (adult) (pediatric): Secondary | ICD-10-CM | POA: Diagnosis not present

## 2017-09-13 DIAGNOSIS — G4733 Obstructive sleep apnea (adult) (pediatric): Secondary | ICD-10-CM | POA: Diagnosis not present

## 2017-09-19 DIAGNOSIS — B029 Zoster without complications: Secondary | ICD-10-CM | POA: Diagnosis not present

## 2017-10-06 DIAGNOSIS — M797 Fibromyalgia: Secondary | ICD-10-CM | POA: Diagnosis not present

## 2017-10-06 DIAGNOSIS — Z79891 Long term (current) use of opiate analgesic: Secondary | ICD-10-CM | POA: Diagnosis not present

## 2017-10-06 DIAGNOSIS — G894 Chronic pain syndrome: Secondary | ICD-10-CM | POA: Diagnosis not present

## 2017-10-06 DIAGNOSIS — M25561 Pain in right knee: Secondary | ICD-10-CM | POA: Diagnosis not present

## 2017-10-12 DIAGNOSIS — G629 Polyneuropathy, unspecified: Secondary | ICD-10-CM | POA: Diagnosis not present

## 2017-10-12 DIAGNOSIS — M79643 Pain in unspecified hand: Secondary | ICD-10-CM | POA: Diagnosis not present

## 2017-10-12 DIAGNOSIS — R110 Nausea: Secondary | ICD-10-CM | POA: Diagnosis not present

## 2017-10-12 DIAGNOSIS — Z6841 Body Mass Index (BMI) 40.0 and over, adult: Secondary | ICD-10-CM | POA: Diagnosis not present

## 2017-10-12 DIAGNOSIS — R002 Palpitations: Secondary | ICD-10-CM | POA: Diagnosis not present

## 2017-11-15 DIAGNOSIS — G4733 Obstructive sleep apnea (adult) (pediatric): Secondary | ICD-10-CM | POA: Diagnosis not present

## 2017-11-29 DIAGNOSIS — B0223 Postherpetic polyneuropathy: Secondary | ICD-10-CM | POA: Diagnosis not present

## 2017-11-29 DIAGNOSIS — F314 Bipolar disorder, current episode depressed, severe, without psychotic features: Secondary | ICD-10-CM | POA: Diagnosis not present

## 2017-12-15 DIAGNOSIS — Z5181 Encounter for therapeutic drug level monitoring: Secondary | ICD-10-CM | POA: Diagnosis not present

## 2017-12-15 DIAGNOSIS — M25561 Pain in right knee: Secondary | ICD-10-CM | POA: Diagnosis not present

## 2017-12-15 DIAGNOSIS — Z79891 Long term (current) use of opiate analgesic: Secondary | ICD-10-CM | POA: Diagnosis not present

## 2017-12-15 DIAGNOSIS — G894 Chronic pain syndrome: Secondary | ICD-10-CM | POA: Diagnosis not present

## 2017-12-15 DIAGNOSIS — M797 Fibromyalgia: Secondary | ICD-10-CM | POA: Diagnosis not present

## 2017-12-20 DIAGNOSIS — G4733 Obstructive sleep apnea (adult) (pediatric): Secondary | ICD-10-CM | POA: Diagnosis not present

## 2018-01-19 DIAGNOSIS — G4733 Obstructive sleep apnea (adult) (pediatric): Secondary | ICD-10-CM | POA: Diagnosis not present

## 2018-02-24 DIAGNOSIS — G4733 Obstructive sleep apnea (adult) (pediatric): Secondary | ICD-10-CM | POA: Diagnosis not present

## 2018-03-16 DIAGNOSIS — M25561 Pain in right knee: Secondary | ICD-10-CM | POA: Diagnosis not present

## 2018-03-16 DIAGNOSIS — G894 Chronic pain syndrome: Secondary | ICD-10-CM | POA: Diagnosis not present

## 2018-03-16 DIAGNOSIS — M797 Fibromyalgia: Secondary | ICD-10-CM | POA: Diagnosis not present

## 2018-03-16 DIAGNOSIS — Z79891 Long term (current) use of opiate analgesic: Secondary | ICD-10-CM | POA: Diagnosis not present

## 2018-04-11 DIAGNOSIS — G4733 Obstructive sleep apnea (adult) (pediatric): Secondary | ICD-10-CM | POA: Diagnosis not present

## 2018-05-11 DIAGNOSIS — G4733 Obstructive sleep apnea (adult) (pediatric): Secondary | ICD-10-CM | POA: Diagnosis not present

## 2018-06-06 DIAGNOSIS — E282 Polycystic ovarian syndrome: Secondary | ICD-10-CM | POA: Diagnosis not present

## 2018-06-06 DIAGNOSIS — M549 Dorsalgia, unspecified: Secondary | ICD-10-CM | POA: Diagnosis not present

## 2018-06-06 DIAGNOSIS — E782 Mixed hyperlipidemia: Secondary | ICD-10-CM | POA: Diagnosis not present

## 2018-06-06 DIAGNOSIS — R7303 Prediabetes: Secondary | ICD-10-CM | POA: Diagnosis not present

## 2018-06-06 DIAGNOSIS — E559 Vitamin D deficiency, unspecified: Secondary | ICD-10-CM | POA: Diagnosis not present

## 2018-06-27 DIAGNOSIS — M797 Fibromyalgia: Secondary | ICD-10-CM | POA: Diagnosis not present

## 2018-06-27 DIAGNOSIS — B0223 Postherpetic polyneuropathy: Secondary | ICD-10-CM | POA: Diagnosis not present

## 2018-06-27 DIAGNOSIS — M25561 Pain in right knee: Secondary | ICD-10-CM | POA: Diagnosis not present

## 2018-06-27 DIAGNOSIS — G894 Chronic pain syndrome: Secondary | ICD-10-CM | POA: Diagnosis not present

## 2018-06-27 DIAGNOSIS — F3131 Bipolar disorder, current episode depressed, mild: Secondary | ICD-10-CM | POA: Diagnosis not present

## 2018-06-27 DIAGNOSIS — Z79891 Long term (current) use of opiate analgesic: Secondary | ICD-10-CM | POA: Diagnosis not present

## 2018-07-06 DIAGNOSIS — G4733 Obstructive sleep apnea (adult) (pediatric): Secondary | ICD-10-CM | POA: Diagnosis not present

## 2018-08-08 DIAGNOSIS — G4730 Sleep apnea, unspecified: Secondary | ICD-10-CM | POA: Diagnosis not present

## 2018-08-08 DIAGNOSIS — L308 Other specified dermatitis: Secondary | ICD-10-CM | POA: Diagnosis not present

## 2018-08-08 DIAGNOSIS — R5381 Other malaise: Secondary | ICD-10-CM | POA: Diagnosis not present

## 2018-08-09 DIAGNOSIS — G4733 Obstructive sleep apnea (adult) (pediatric): Secondary | ICD-10-CM | POA: Diagnosis not present

## 2018-09-07 DIAGNOSIS — M797 Fibromyalgia: Secondary | ICD-10-CM | POA: Diagnosis not present

## 2018-09-07 DIAGNOSIS — Z79891 Long term (current) use of opiate analgesic: Secondary | ICD-10-CM | POA: Diagnosis not present

## 2018-09-07 DIAGNOSIS — G894 Chronic pain syndrome: Secondary | ICD-10-CM | POA: Diagnosis not present

## 2018-09-07 DIAGNOSIS — M25561 Pain in right knee: Secondary | ICD-10-CM | POA: Diagnosis not present

## 2018-09-10 DIAGNOSIS — G4733 Obstructive sleep apnea (adult) (pediatric): Secondary | ICD-10-CM | POA: Diagnosis not present

## 2018-09-10 DIAGNOSIS — E282 Polycystic ovarian syndrome: Secondary | ICD-10-CM | POA: Diagnosis not present

## 2018-09-10 DIAGNOSIS — R7303 Prediabetes: Secondary | ICD-10-CM | POA: Diagnosis not present

## 2018-09-10 DIAGNOSIS — Z9989 Dependence on other enabling machines and devices: Secondary | ICD-10-CM | POA: Diagnosis not present

## 2018-09-11 DIAGNOSIS — G4733 Obstructive sleep apnea (adult) (pediatric): Secondary | ICD-10-CM | POA: Diagnosis not present

## 2018-09-18 DIAGNOSIS — F314 Bipolar disorder, current episode depressed, severe, without psychotic features: Secondary | ICD-10-CM | POA: Diagnosis not present

## 2018-09-18 DIAGNOSIS — G4730 Sleep apnea, unspecified: Secondary | ICD-10-CM | POA: Diagnosis not present

## 2018-10-02 DIAGNOSIS — G4730 Sleep apnea, unspecified: Secondary | ICD-10-CM | POA: Diagnosis not present

## 2018-10-02 DIAGNOSIS — F314 Bipolar disorder, current episode depressed, severe, without psychotic features: Secondary | ICD-10-CM | POA: Diagnosis not present

## 2018-10-24 ENCOUNTER — Ambulatory Visit: Payer: Medicare Other | Admitting: Skilled Nursing Facility1

## 2018-10-31 DIAGNOSIS — E782 Mixed hyperlipidemia: Secondary | ICD-10-CM | POA: Diagnosis not present

## 2018-10-31 DIAGNOSIS — E559 Vitamin D deficiency, unspecified: Secondary | ICD-10-CM | POA: Diagnosis not present

## 2018-10-31 DIAGNOSIS — R7303 Prediabetes: Secondary | ICD-10-CM | POA: Diagnosis not present

## 2018-10-31 DIAGNOSIS — F310 Bipolar disorder, current episode hypomanic: Secondary | ICD-10-CM | POA: Diagnosis not present

## 2018-10-31 DIAGNOSIS — M549 Dorsalgia, unspecified: Secondary | ICD-10-CM | POA: Diagnosis not present

## 2018-11-14 DIAGNOSIS — N926 Irregular menstruation, unspecified: Secondary | ICD-10-CM | POA: Diagnosis not present

## 2018-11-14 DIAGNOSIS — N644 Mastodynia: Secondary | ICD-10-CM | POA: Diagnosis not present

## 2018-11-14 DIAGNOSIS — H938X3 Other specified disorders of ear, bilateral: Secondary | ICD-10-CM | POA: Diagnosis not present

## 2018-11-14 DIAGNOSIS — E559 Vitamin D deficiency, unspecified: Secondary | ICD-10-CM | POA: Diagnosis not present

## 2018-11-14 DIAGNOSIS — E282 Polycystic ovarian syndrome: Secondary | ICD-10-CM | POA: Diagnosis not present

## 2018-11-23 DIAGNOSIS — M25561 Pain in right knee: Secondary | ICD-10-CM | POA: Diagnosis not present

## 2018-11-23 DIAGNOSIS — G894 Chronic pain syndrome: Secondary | ICD-10-CM | POA: Diagnosis not present

## 2018-11-23 DIAGNOSIS — Z5181 Encounter for therapeutic drug level monitoring: Secondary | ICD-10-CM | POA: Diagnosis not present

## 2018-11-23 DIAGNOSIS — Z79891 Long term (current) use of opiate analgesic: Secondary | ICD-10-CM | POA: Diagnosis not present

## 2018-11-23 DIAGNOSIS — M797 Fibromyalgia: Secondary | ICD-10-CM | POA: Diagnosis not present

## 2018-12-13 DIAGNOSIS — B0223 Postherpetic polyneuropathy: Secondary | ICD-10-CM | POA: Diagnosis not present

## 2018-12-13 DIAGNOSIS — F3130 Bipolar disorder, current episode depressed, mild or moderate severity, unspecified: Secondary | ICD-10-CM | POA: Diagnosis not present

## 2018-12-13 DIAGNOSIS — G4730 Sleep apnea, unspecified: Secondary | ICD-10-CM | POA: Diagnosis not present

## 2018-12-18 DIAGNOSIS — G4733 Obstructive sleep apnea (adult) (pediatric): Secondary | ICD-10-CM | POA: Diagnosis not present

## 2018-12-19 DIAGNOSIS — F310 Bipolar disorder, current episode hypomanic: Secondary | ICD-10-CM | POA: Diagnosis not present

## 2019-01-17 DIAGNOSIS — G4733 Obstructive sleep apnea (adult) (pediatric): Secondary | ICD-10-CM | POA: Diagnosis not present

## 2019-01-29 DIAGNOSIS — F310 Bipolar disorder, current episode hypomanic: Secondary | ICD-10-CM | POA: Diagnosis not present

## 2019-02-06 DIAGNOSIS — E559 Vitamin D deficiency, unspecified: Secondary | ICD-10-CM | POA: Diagnosis not present

## 2019-02-15 DIAGNOSIS — M25561 Pain in right knee: Secondary | ICD-10-CM | POA: Diagnosis not present

## 2019-02-15 DIAGNOSIS — M797 Fibromyalgia: Secondary | ICD-10-CM | POA: Diagnosis not present

## 2019-02-15 DIAGNOSIS — Z79891 Long term (current) use of opiate analgesic: Secondary | ICD-10-CM | POA: Diagnosis not present

## 2019-02-15 DIAGNOSIS — G894 Chronic pain syndrome: Secondary | ICD-10-CM | POA: Diagnosis not present

## 2019-02-19 DIAGNOSIS — G4733 Obstructive sleep apnea (adult) (pediatric): Secondary | ICD-10-CM | POA: Diagnosis not present

## 2019-03-06 DIAGNOSIS — F310 Bipolar disorder, current episode hypomanic: Secondary | ICD-10-CM | POA: Diagnosis not present

## 2019-03-11 ENCOUNTER — Other Ambulatory Visit: Payer: Self-pay | Admitting: Physician Assistant

## 2019-03-11 ENCOUNTER — Other Ambulatory Visit (HOSPITAL_COMMUNITY)
Admission: RE | Admit: 2019-03-11 | Discharge: 2019-03-11 | Disposition: A | Discharge status: Home / Self Care | Payer: PPO | Source: Ambulatory Visit | Attending: Physician Assistant | Admitting: Physician Assistant

## 2019-03-11 DIAGNOSIS — M2550 Pain in unspecified joint: Secondary | ICD-10-CM | POA: Diagnosis not present

## 2019-03-11 DIAGNOSIS — R7309 Other abnormal glucose: Secondary | ICD-10-CM | POA: Diagnosis not present

## 2019-03-11 DIAGNOSIS — Z113 Encounter for screening for infections with a predominantly sexual mode of transmission: Secondary | ICD-10-CM | POA: Diagnosis not present

## 2019-03-11 DIAGNOSIS — R946 Abnormal results of thyroid function studies: Secondary | ICD-10-CM | POA: Diagnosis not present

## 2019-03-11 DIAGNOSIS — Z124 Encounter for screening for malignant neoplasm of cervix: Secondary | ICD-10-CM | POA: Diagnosis not present

## 2019-03-11 DIAGNOSIS — N3000 Acute cystitis without hematuria: Secondary | ICD-10-CM | POA: Diagnosis not present

## 2019-03-11 DIAGNOSIS — Z1389 Encounter for screening for other disorder: Secondary | ICD-10-CM | POA: Diagnosis not present

## 2019-03-11 DIAGNOSIS — R300 Dysuria: Secondary | ICD-10-CM | POA: Diagnosis not present

## 2019-03-11 DIAGNOSIS — M797 Fibromyalgia: Secondary | ICD-10-CM | POA: Diagnosis not present

## 2019-03-11 DIAGNOSIS — E0590 Thyrotoxicosis, unspecified without thyrotoxic crisis or storm: Secondary | ICD-10-CM | POA: Diagnosis not present

## 2019-03-11 DIAGNOSIS — Z23 Encounter for immunization: Secondary | ICD-10-CM | POA: Diagnosis not present

## 2019-03-11 DIAGNOSIS — E559 Vitamin D deficiency, unspecified: Secondary | ICD-10-CM | POA: Diagnosis not present

## 2019-03-11 DIAGNOSIS — Z0000 Encounter for general adult medical examination without abnormal findings: Secondary | ICD-10-CM | POA: Diagnosis not present

## 2019-03-11 DIAGNOSIS — E782 Mixed hyperlipidemia: Secondary | ICD-10-CM | POA: Diagnosis not present

## 2019-03-13 LAB — CYTOLOGY - PAP
Chlamydia: NEGATIVE
Comment: NEGATIVE
Comment: NEGATIVE
Comment: NORMAL
Diagnosis: NEGATIVE
Neisseria Gonorrhea: NEGATIVE
Trichomonas: NEGATIVE

## 2019-03-20 ENCOUNTER — Other Ambulatory Visit: Payer: Self-pay | Admitting: Physician Assistant

## 2019-03-20 DIAGNOSIS — Z1231 Encounter for screening mammogram for malignant neoplasm of breast: Secondary | ICD-10-CM

## 2019-03-27 DIAGNOSIS — M25562 Pain in left knee: Secondary | ICD-10-CM | POA: Diagnosis not present

## 2019-03-27 DIAGNOSIS — M25561 Pain in right knee: Secondary | ICD-10-CM | POA: Diagnosis not present

## 2019-04-23 DIAGNOSIS — F310 Bipolar disorder, current episode hypomanic: Secondary | ICD-10-CM | POA: Diagnosis not present

## 2019-04-25 ENCOUNTER — Ambulatory Visit: Payer: Medicare Other | Admitting: Allergy & Immunology

## 2019-04-30 DIAGNOSIS — G4730 Sleep apnea, unspecified: Secondary | ICD-10-CM | POA: Diagnosis not present

## 2019-04-30 DIAGNOSIS — F3131 Bipolar disorder, current episode depressed, mild: Secondary | ICD-10-CM | POA: Diagnosis not present

## 2019-04-30 DIAGNOSIS — B0223 Postherpetic polyneuropathy: Secondary | ICD-10-CM | POA: Diagnosis not present

## 2019-05-01 ENCOUNTER — Ambulatory Visit: Payer: Medicare Other

## 2019-05-16 ENCOUNTER — Ambulatory Visit: Payer: Medicare Other | Admitting: Allergy & Immunology

## 2019-05-22 DIAGNOSIS — F310 Bipolar disorder, current episode hypomanic: Secondary | ICD-10-CM | POA: Diagnosis not present

## 2019-05-28 DIAGNOSIS — G894 Chronic pain syndrome: Secondary | ICD-10-CM | POA: Diagnosis not present

## 2019-05-28 DIAGNOSIS — Z79891 Long term (current) use of opiate analgesic: Secondary | ICD-10-CM | POA: Diagnosis not present

## 2019-05-28 DIAGNOSIS — M797 Fibromyalgia: Secondary | ICD-10-CM | POA: Diagnosis not present

## 2019-05-28 DIAGNOSIS — M25561 Pain in right knee: Secondary | ICD-10-CM | POA: Diagnosis not present

## 2019-05-30 ENCOUNTER — Ambulatory Visit: Payer: Medicare Other

## 2019-06-07 DIAGNOSIS — F310 Bipolar disorder, current episode hypomanic: Secondary | ICD-10-CM | POA: Diagnosis not present

## 2019-06-11 DIAGNOSIS — G4733 Obstructive sleep apnea (adult) (pediatric): Secondary | ICD-10-CM | POA: Diagnosis not present

## 2019-06-17 ENCOUNTER — Ambulatory Visit: Payer: Medicare Other

## 2019-06-21 DIAGNOSIS — F310 Bipolar disorder, current episode hypomanic: Secondary | ICD-10-CM | POA: Diagnosis not present

## 2019-06-24 NOTE — Progress Notes (Signed)
Office Visit Note  Patient: Krystal French             Date of Birth: 1975/05/26           MRN: [id number]             PCP: Donald Prose, MD Referring: Ephriam Jenkins, PA Visit Date: 06/26/2019 Occupation: @GUAROCC @  Subjective:  Pain in multiple joints.   History of Present Illness: Krystal French is a 44 y.o. female with history of fibromyalgia syndrome.  Patient states she was diagnosed with fibromyalgia about 10 years ago.  She has been in pain management for the last many years for which she goes to [place].  She states for the last few months she has been experiencing increased joint pain and has been running a low-grade fever.  She has noted swelling in her hands and feet.  She states she is having difficulty gripping objects.  She also has some discomfort in the trapezius area, her hips and her elbows.  She states recently she developed a rash on her hand which was diagnosed with eczema but the topical agents did not help.  She still have dry skin on her hands.  She states she was diagnosed with carpal tunnel syndrome in the past and she continues to have numbness in her hands and uses carpal tunnel braces which are helpful.  She is also diagnosed with neuropathy several years ago.  Activities of Daily Living:  Patient reports morning stiffness for 24 hours.   Patient Reports nocturnal pain.  Difficulty dressing/grooming: Reports Difficulty climbing stairs: Reports Difficulty getting out of chair: Denies Difficulty using hands for taps, buttons, cutlery, and/or writing: Reports  Review of Systems  Constitutional: Positive for fatigue. Negative for night sweats, weight gain and weight loss.  HENT: Positive for mouth dryness. Negative for mouth sores, trouble swallowing, trouble swallowing and nose dryness.   Eyes: Negative for pain, redness, visual disturbance and dryness.  Respiratory: Negative for cough, shortness of breath and difficulty breathing.   Cardiovascular: Positive for  swelling in legs/feet. Negative for chest pain, palpitations, hypertension and irregular heartbeat.  Gastrointestinal: Negative for blood in stool, constipation and diarrhea.  Endocrine: Positive for heat intolerance. Negative for increased urination.  Genitourinary: Negative for difficulty urinating and vaginal dryness.  Musculoskeletal: Positive for arthralgias, gait problem, joint pain, joint swelling, morning stiffness and muscle tenderness. Negative for myalgias, muscle weakness and myalgias.  Skin: Positive for rash. Negative for color change, hair loss, skin tightness, ulcers and sensitivity to sunlight.  Allergic/Immunologic: Positive for susceptible to infections.  Neurological: Positive for numbness. Negative for dizziness, memory loss, night sweats and weakness.  Hematological: Negative for bruising/bleeding tendency and swollen glands.  Psychiatric/Behavioral: Positive for depressed mood and sleep disturbance. The patient is nervous/anxious.     PMFS History:  Patient Active Problem List   Diagnosis Date Noted  . History of PCOS 06/26/2019  . History of bipolar disorder 06/26/2019  . Hx of esophageal reflux 06/26/2019  . History of hyperlipidemia 06/26/2019  . History of kidney stones 06/26/2019  . Sleep apnea with use of continuous positive airway pressure (CPAP) 06/26/2019  . Other insomnia 06/26/2019  . Hx of migraines 06/26/2019  . Vitamin D deficiency 06/26/2019    Past Medical History:  Diagnosis Date  . Anxiety   . Bipolar 2 disorder ([place])   . Fibromyalgia   . Migraine   . Morbid obesity ([place])   . Neuropathy   . Polycystic ovaries   .  Sleep apnea     Family History  Problem Relation Age of Onset  . Fibromyalgia Mother   . Sleep apnea Father   . High Cholesterol Father   . Dementia Father   . Hypertension Father   . ADD / ADHD Father    Past Surgical History:  Procedure Laterality Date  . CHOLECYSTECTOMY    . knee arthroscopic    . WISDOM TOOTH  EXTRACTION     Social History   Social History Narrative  . Not on file    There is no immunization history on file for this patient.   Objective: Vital Signs: BP (!) 147/84 (BP Location: Right Arm, Patient Position: Sitting, Cuff Size: Normal)   Pulse 100   Resp (!) 22   Ht 5\' 9"  (1.753 m)   Wt (!) 417 lb (189.1 kg)   LMP 06/24/2019   BMI 61.58 kg/m    Physical Exam Vitals and nursing note reviewed.  Constitutional:      Appearance: She is well-developed.  HENT:     Head: Normocephalic and atraumatic.  Eyes:     Conjunctiva/sclera: Conjunctivae normal.  Cardiovascular:     Rate and Rhythm: Normal rate and regular rhythm.     Heart sounds: Normal heart sounds.  Pulmonary:     Effort: Pulmonary effort is normal.     Breath sounds: Normal breath sounds.  Abdominal:     General: Bowel sounds are normal.     Palpations: Abdomen is soft.  Musculoskeletal:     Cervical back: Normal range of motion.  Lymphadenopathy:     Cervical: No cervical adenopathy.  Skin:    General: Skin is warm and dry.     Capillary Refill: Capillary refill takes less than 2 seconds.     Comments: Few dry scales were noted between her fingers of her right hand.  These findings are consistent with eczema.  Neurological:     Mental Status: She is alert and oriented to person, place, and time.  Psychiatric:        Behavior: Behavior normal.      Musculoskeletal Exam: C-spine was in good range of motion.  She is some tenderness over trapezius area.  Shoulder joints, elbow joints, wrist joints, MCPs, PIPs and DIPs with good range of motion with no synovitis.  Hip joints, knee joints, ankles, MTPs and PIPs with good range of motion with no synovitis.  She has some tenderness across MTPs and PIPs.  CDAI Exam: CDAI Score: -- Patient Global: --; Provider Global: -- Swollen: --; Tender: -- Joint Exam 06/26/2019   No joint exam has been documented for this visit   There is currently no information  documented on the homunculus. Go to the Rheumatology activity and complete the homunculus joint exam.  Investigation: No additional findings.  Imaging: XR Foot 2 Views Left  Result Date: 06/26/2019 First MTP, PIP and DIP narrowing was noted.  Inferior and posterior calcaneal spurs were noted.  No intertarsal joint space narrowing was noted.  No tibiotalar joint space narrowing was noted.  Dorsal spurring was noted. Impression: These findings are consistent with osteoarthritis of the foot.  XR Foot 2 Views Right  Result Date: 06/26/2019 First MTP, PIP and DIP narrowing was noted.  Inferior and posterior calcaneal spurs were noted.  No intertarsal joint space narrowing was noted.  No tibiotalar joint space narrowing was noted.  Dorsal spurring was noted. Impression: These findings are consistent with osteoarthritis of the foot.  XR Hand 2  View Left  Result Date: 06/26/2019 Mild CMC, PIP and DIP narrowing was noted.  No MCP, intercarpal or radiocarpal joint space narrowing was noted.  No erosive changes were noted. Impression: These findings are consistent with mild osteoarthritis.  XR Hand 2 View Right  Result Date: 06/26/2019 Mild CMC, PIP and DIP narrowing was noted.  No MCP, intercarpal or radiocarpal joint space narrowing was noted.  No erosive changes were noted. Impression: These findings are consistent with mild osteoarthritis.   Recent Labs: Lab Results  Component Value Date   WBC 8.3 03/24/2015   HGB 12.2 03/24/2015   PLT 214 03/24/2015   NA 137 03/24/2015   K 4.7 03/24/2015   CL 104 03/24/2015   CO2 25 03/24/2015   GLUCOSE 101 (H) 03/24/2015   BUN 11 03/24/2015   CREATININE 0.95 03/24/2015   BILITOT 0.2 (L) 09/07/2012   ALKPHOS 77 09/07/2012   AST 11 09/07/2012   ALT 12 09/07/2012   PROT 7.5 09/07/2012   ALBUMIN 3.7 09/07/2012   CALCIUM 8.8 (L) 03/24/2015   GFRAA >60 03/24/2015    Speciality Comments: No specialty comments available.  Procedures:  No  procedures performed Allergies: Codeine, Morphine and related, Latex, and Sulfa antibiotics   Assessment / Plan:     Visit Diagnoses: Polyarthralgia-patient complains of pain and discomfort in multiple joints.  Pain in both hands -patient complains of pain in her both hands.  No synovitis was noted on examination.  She also complains of decreased grip strength.  Plan: XR Hand 2 View Right, XR Hand 2 View Left.  X-ray of bilateral hands were consistent with mild osteoarthritis.  Pain in both feet -she complains of pain and discomfort in bilateral feet.  Is no synovitis was noted.  Plan: XR Foot 2 Views Right, XR Foot 2 Views Left.  X-rays were consistent with osteoarthritis.  Positive ANA (antinuclear antibody) - +ANA 2015.  Patient is concerned about possible lupus.  She has no clinical features of systemic lupus.  I will obtain [facility] labs.  Rash-dry skin was noted on her hands and her feet.  Consistent with eczema.  Fibromyalgia-she continues to have some generalized pain and discomfort.  She has trapezius spasm pain in the epicondyle region and trochanteric area.  Chronic pain syndrome-she has been going to pain management in [place].  Other medical problems are listed as follows:  Vitamin D deficiency-she is on vitamin D supplement.  Hx of migraines  Other insomnia  Sleep apnea with use of continuous positive airway pressure (CPAP)  History of kidney stones  History of hyperlipidemia  Hx of esophageal reflux  History of bipolar disorder  History of PCOS  Orders: Orders Placed This Encounter  Procedures  . XR Hand 2 View Right  . XR Hand 2 View Left  . XR Foot 2 Views Right  . XR Foot 2 Views Left   No orders of the defined types were placed in this encounter.   Face-to-face time spent with patient was 50 minutes. Greater than 50% of time was spent in counseling and coordination of care.  Follow-Up Instructions: Return for Positive ANA, polyarthralgia.   Bo Merino, MD  Note - This record has been created using Editor, commissioning.  Chart creation errors have been sought, but may not always  have been located. Such creation errors do not reflect on  the standard of medical care.

## 2019-06-26 ENCOUNTER — Other Ambulatory Visit: Payer: Self-pay

## 2019-06-26 ENCOUNTER — Encounter: Payer: Self-pay | Admitting: Rheumatology

## 2019-06-26 ENCOUNTER — Ambulatory Visit: Payer: Medicare Other | Admitting: Rheumatology

## 2019-06-26 ENCOUNTER — Ambulatory Visit: Payer: Self-pay

## 2019-06-26 VITALS — BP 147/84 | HR 100 | Resp 22 | Ht 69.0 in | Wt >= 6400 oz

## 2019-06-26 DIAGNOSIS — Z8659 Personal history of other mental and behavioral disorders: Secondary | ICD-10-CM | POA: Insufficient documentation

## 2019-06-26 DIAGNOSIS — R768 Other specified abnormal immunological findings in serum: Secondary | ICD-10-CM | POA: Diagnosis not present

## 2019-06-26 DIAGNOSIS — M79642 Pain in left hand: Secondary | ICD-10-CM | POA: Diagnosis not present

## 2019-06-26 DIAGNOSIS — Z8669 Personal history of other diseases of the nervous system and sense organs: Secondary | ICD-10-CM

## 2019-06-26 DIAGNOSIS — G4730 Sleep apnea, unspecified: Secondary | ICD-10-CM | POA: Diagnosis not present

## 2019-06-26 DIAGNOSIS — G4709 Other insomnia: Secondary | ICD-10-CM

## 2019-06-26 DIAGNOSIS — Z87442 Personal history of urinary calculi: Secondary | ICD-10-CM | POA: Diagnosis not present

## 2019-06-26 DIAGNOSIS — Z8639 Personal history of other endocrine, nutritional and metabolic disease: Secondary | ICD-10-CM | POA: Diagnosis not present

## 2019-06-26 DIAGNOSIS — M79641 Pain in right hand: Secondary | ICD-10-CM

## 2019-06-26 DIAGNOSIS — M79671 Pain in right foot: Secondary | ICD-10-CM

## 2019-06-26 DIAGNOSIS — M79672 Pain in left foot: Secondary | ICD-10-CM

## 2019-06-26 DIAGNOSIS — M797 Fibromyalgia: Secondary | ICD-10-CM | POA: Diagnosis not present

## 2019-06-26 DIAGNOSIS — Z8719 Personal history of other diseases of the digestive system: Secondary | ICD-10-CM

## 2019-06-26 DIAGNOSIS — R21 Rash and other nonspecific skin eruption: Secondary | ICD-10-CM

## 2019-06-26 DIAGNOSIS — Z8742 Personal history of other diseases of the female genital tract: Secondary | ICD-10-CM

## 2019-06-26 DIAGNOSIS — M2550 Pain in unspecified joint: Secondary | ICD-10-CM

## 2019-06-26 DIAGNOSIS — E559 Vitamin D deficiency, unspecified: Secondary | ICD-10-CM | POA: Diagnosis not present

## 2019-06-26 DIAGNOSIS — G894 Chronic pain syndrome: Secondary | ICD-10-CM

## 2019-07-02 ENCOUNTER — Ambulatory Visit: Payer: PPO | Admitting: Allergy & Immunology

## 2019-07-08 NOTE — Progress Notes (Deleted)
Office Visit Note  Patient: Krystal French             Date of Birth: 13-Sep-1975           MRN: [id number]             PCP: Donald Prose, MD Referring: Belva Crome, MD Visit Date: 07/11/2019 Occupation: @GUAROCC @  Subjective:  No chief complaint on file.   History of Present Illness: Krystal French is a 44 y.o. female ***   Activities of Daily Living:  Patient reports morning stiffness for *** {minute/hour:19697}.   Patient {ACTIONS;DENIES/REPORTS:21021675::"Denies"} nocturnal pain.  Difficulty dressing/grooming: {ACTIONS;DENIES/REPORTS:21021675::"Denies"} Difficulty climbing stairs: {ACTIONS;DENIES/REPORTS:21021675::"Denies"} Difficulty getting out of chair: {ACTIONS;DENIES/REPORTS:21021675::"Denies"} Difficulty using hands for taps, buttons, cutlery, and/or writing: {ACTIONS;DENIES/REPORTS:21021675::"Denies"}  No Rheumatology ROS completed.   PMFS History:  Patient Active Problem List   Diagnosis Date Noted  . History of PCOS 06/26/2019  . History of bipolar disorder 06/26/2019  . Hx of esophageal reflux 06/26/2019  . History of hyperlipidemia 06/26/2019  . History of kidney stones 06/26/2019  . Sleep apnea with use of continuous positive airway pressure (CPAP) 06/26/2019  . Other insomnia 06/26/2019  . Hx of migraines 06/26/2019  . Vitamin D deficiency 06/26/2019    Past Medical History:  Diagnosis Date  . Anxiety   . Bipolar 2 disorder ([place])   . Fibromyalgia   . Migraine   . Morbid obesity ([place])   . Neuropathy   . Polycystic ovaries   . Sleep apnea     Family History  Problem Relation Age of Onset  . Fibromyalgia Mother   . Sleep apnea Father   . High Cholesterol Father   . Dementia Father   . Hypertension Father   . ADD / ADHD Father    Past Surgical History:  Procedure Laterality Date  . CHOLECYSTECTOMY    . knee arthroscopic    . WISDOM TOOTH EXTRACTION     Social History   Social History Narrative  . Not on file    There is no  immunization history on file for this patient.   Objective: Vital Signs: LMP 06/24/2019    Physical Exam   Musculoskeletal Exam: ***  CDAI Exam: CDAI Score: -- Patient Global: --; Provider Global: -- Swollen: --; Tender: -- Joint Exam 07/11/2019   No joint exam has been documented for this visit   There is currently no information documented on the homunculus. Go to the Rheumatology activity and complete the homunculus joint exam.  Investigation: No additional findings.  Imaging: XR Foot 2 Views Left  Result Date: 06/26/2019 First MTP, PIP and DIP narrowing was noted.  Inferior and posterior calcaneal spurs were noted.  No intertarsal joint space narrowing was noted.  No tibiotalar joint space narrowing was noted.  Dorsal spurring was noted. Impression: These findings are consistent with osteoarthritis of the foot.  XR Foot 2 Views Right  Result Date: 06/26/2019 First MTP, PIP and DIP narrowing was noted.  Inferior and posterior calcaneal spurs were noted.  No intertarsal joint space narrowing was noted.  No tibiotalar joint space narrowing was noted.  Dorsal spurring was noted. Impression: These findings are consistent with osteoarthritis of the foot.  XR Hand 2 View Left  Result Date: 06/26/2019 Mild CMC, PIP and DIP narrowing was noted.  No MCP, intercarpal or radiocarpal joint space narrowing was noted.  No erosive changes were noted. Impression: These findings are consistent with mild osteoarthritis.  XR Hand 2 View Right  Result Date: 06/26/2019  Mild CMC, PIP and DIP narrowing was noted.  No MCP, intercarpal or radiocarpal joint space narrowing was noted.  No erosive changes were noted. Impression: These findings are consistent with mild osteoarthritis.   Recent Labs: Lab Results  Component Value Date   WBC 8.3 03/24/2015   HGB 12.2 03/24/2015   PLT 214 03/24/2015   NA 137 03/24/2015   K 4.7 03/24/2015   CL 104 03/24/2015   CO2 25 03/24/2015   GLUCOSE 101 (H)  03/24/2015   BUN 11 03/24/2015   CREATININE 0.95 03/24/2015   BILITOT 0.2 (L) 09/07/2012   ALKPHOS 77 09/07/2012   AST 11 09/07/2012   ALT 12 09/07/2012   PROT 7.5 09/07/2012   ALBUMIN 3.7 09/07/2012   CALCIUM 8.8 (L) 03/24/2015   GFRAA >60 03/24/2015    Speciality Comments: No specialty comments available.  Procedures:  No procedures performed Allergies: Codeine, Morphine and related, Latex, and Sulfa antibiotics   Assessment / Plan:     Visit Diagnoses: No diagnosis found.  Orders: No orders of the defined types were placed in this encounter.  No orders of the defined types were placed in this encounter.   Face-to-face time spent with patient was *** minutes. Greater than 50% of time was spent in counseling and coordination of care.  Follow-Up Instructions: No follow-ups on file.   Bo Merino, MD  Note - This record has been created using Editor, commissioning.  Chart creation errors have been sought, but may not always  have been located. Such creation errors do not reflect on  the standard of medical care.

## 2019-07-11 ENCOUNTER — Ambulatory Visit: Payer: Medicare Other | Admitting: Rheumatology

## 2019-07-23 ENCOUNTER — Telehealth: Payer: Self-pay | Admitting: Rheumatology

## 2019-07-23 NOTE — Telephone Encounter (Signed)
Patient left a voicemail stating she lost her paperwork regarding her [facility] labwork and requesting a return call.

## 2019-07-24 NOTE — Telephone Encounter (Signed)
Spoke with patient and advise patient she may come by the office and pick up a copy of the [facility] lab form. Patient states she will do so and when picking up the form she will reschedule her follow up appointment. She states her father has been sick and she has been caring for him.

## 2019-07-26 ENCOUNTER — Ambulatory Visit: Payer: Medicare Other

## 2019-08-08 ENCOUNTER — Ambulatory Visit: Payer: PPO

## 2019-08-13 ENCOUNTER — Other Ambulatory Visit: Payer: Self-pay

## 2019-08-13 ENCOUNTER — Ambulatory Visit: Payer: PPO | Admitting: Allergy & Immunology

## 2019-08-13 ENCOUNTER — Encounter: Payer: Self-pay | Admitting: Allergy & Immunology

## 2019-08-13 VITALS — BP 132/88 | HR 102 | Temp 98.4°F | Resp 22 | Ht 68.0 in | Wt >= 6400 oz

## 2019-08-13 DIAGNOSIS — J310 Chronic rhinitis: Secondary | ICD-10-CM

## 2019-08-13 DIAGNOSIS — G4733 Obstructive sleep apnea (adult) (pediatric): Secondary | ICD-10-CM | POA: Diagnosis not present

## 2019-08-13 DIAGNOSIS — K9049 Malabsorption due to intolerance, not elsewhere classified: Secondary | ICD-10-CM

## 2019-08-13 NOTE — Patient Instructions (Addendum)
1. Chronic rhinitis - Testing today showed: negative to the entire panel - Copy of test results provided.  - We are going to get blood work to confirm this. - We will call you in 1-2 weeks with the results of the testing.  - Start taking: Zyrtec (cetirizine) 10mg  tablet once daily - You can use an extra dose of the antihistamine, if needed, for breakthrough symptoms.  - Consider nasal saline rinses 1-2 times daily to remove allergens from the nasal cavities as well as help with mucous clearance (this is especially helpful to do before the nasal sprays are given)  2. Food intolerance - Testing to the most common food was negative. - There is a the low positive predictive value of food allergy testing and hence the high possibility of false positives. - In contrast, food allergy testing has a high negative predictive value, therefore if testing is negative we can be relatively assured that they are indeed negative.  - I am going to get blood work for soy just to confirm this.   3. Return in about 3 months (around 11/13/2019). This can be an in-person, a virtual Webex or a telephone follow up visit.   Please inform us of any Emergency Department visits, hospitalizations, or changes in symptoms. Call us before going to the ED for breathing or allergy symptoms since we might be able to fit you in for a sick visit. Feel free to contact us anytime with any questions, problems, or concerns.  It was a pleasure to meet you today!  Websites that have reliable patient information: 1. American Academy of Asthma, Allergy, and Immunology: www.aaaai.org 2. Food Allergy Research and Education (FARE): foodallergy.org 3. Mothers of Asthmatics: http://www.asthmacommunitynetwork.org 4. American College of Allergy, Asthma, and Immunology: www.acaai.org   COVID-19 Vaccine Information can be found at: ShippingScam.co.uk For questions related to vaccine  distribution or appointments, please email vaccine@ [email or web address] or call 4376909015.     Like [place] on [place] and Instagram for our latest updates!        Make sure you are registered to vote! If you have moved or changed any of your contact information, you will need to get this updated before voting!  In some cases, you MAY be able to register to vote online: [email or web address]

## 2019-08-13 NOTE — Progress Notes (Signed)
NEW PATIENT  Date of Service/Encounter:  08/13/19  Referring provider: Ephriam Jenkins, PA   Assessment:   Chronic rhinitis - with negative testing to the entire environmental panel  Food intolerance - with negative testing to the most common foods  Obstructive sleep apnea - on CPAP  GERD  Complicated past medical history including bipolar depression as well as fibromyalgia  On disability  Plan/Recommendations:   1. Chronic rhinitis - Testing today showed: negative to the entire panel - Copy of test results provided.  - We are going to get blood work to confirm this. - We will call you in 1-2 weeks with the results of the testing.  - Start taking: Zyrtec (cetirizine) 42m tablet once daily - You can use an extra dose of the antihistamine, if needed, for breakthrough symptoms.  - Consider nasal saline rinses 1-2 times daily to remove allergens from the nasal cavities as well as help with mucous clearance (this is especially helpful to do before the nasal sprays are given)  2. Food intolerance - Testing to the most common food was negative. - There is a the low positive predictive value of food allergy testing and hence the high possibility of false positives. - In contrast, food allergy testing has a high negative predictive value, therefore if testing is negative we can be relatively assured that they are indeed negative.  - I am going to get blood work for soy just to confirm this.   3. Return in about 3 months (around 11/13/2019). This can be an in-person, a virtual Webex or a telephone follow up visit.  Subjective:   Krystal French a 44y.o. female presenting today for evaluation of  Chief Complaint  Patient presents with  . Headache    triggered by soy  . Allergic Rhinitis     itchy/watery eyes,   . Anal Itching    Krystal GPriegohas a history of the following: Patient Active Problem List   Diagnosis Date Noted  . History of PCOS 06/26/2019  . History  of bipolar disorder 06/26/2019  . Hx of esophageal reflux 06/26/2019  . History of hyperlipidemia 06/26/2019  . History of kidney stones 06/26/2019  . Sleep apnea with use of continuous positive airway pressure (CPAP) 06/26/2019  . Other insomnia 06/26/2019  . Hx of migraines 06/26/2019  . Vitamin D deficiency 06/26/2019    History obtained from: chart review and patient.  Krystal Mireswas referred by QEphriam Jenkins PA.     Krystal French a 44y.o. female presenting for an evaluation of possible allergic rhinitis and adverse food reactions.  Allergic Rhinitis Symptom History: She reports a history of itchy watery eyes and runny nose. This has been a problem since she was a very young child. She was on Chlrotabs routinely and she had to mow the grass with a mask because of all of the pollen picked up. She has never been tested. [place] [facility]  Food Allergy Symptom History: She reports that she has migraines to soy. She thinks that this is related to the soy. She reports that she has itching all over without hives. This is very concerning for her. This was over the last 3-4 months. The first migraine that she had that was not associated with her period was when she ate at a [employer] She was diagnosed with an MSG allergy, but then she noticed  the symptoms even after eating something other than [place] food. She reports some stomach problems and migraine. The uniting factor was soy and a couple of other things. She never had anaphylaxis to this and never had problems with needing an EpiPen.  She does report a history of a latex allergy in 1995 when she was dissecting a rabbit. She was using a glove and the skin started coming "off in sheets". She got the unpowdered ones and it happened again. She has avoided latex since that time. She also reports a nickel allergy.   She did get Advair once when she got a  cold. But this was years ago. She only ever got a sample from it. There were some times with the exertion when she could not breathe. She does sometimes feel that same way.   She has bipolar depression and has disability from this. She also developed fibromyalgia which popped up later.   Otherwise, there is no history of other atopic diseases, including asthma, drug allergies, stinging insect allergies, eczema or urticaria. There is no significant infectious history. Vaccinations are up to date.    Past Medical History: Patient Active Problem List   Diagnosis Date Noted  . History of PCOS 06/26/2019  . History of bipolar disorder 06/26/2019  . Hx of esophageal reflux 06/26/2019  . History of hyperlipidemia 06/26/2019  . History of kidney stones 06/26/2019  . Sleep apnea with use of continuous positive airway pressure (CPAP) 06/26/2019  . Other insomnia 06/26/2019  . Hx of migraines 06/26/2019  . Vitamin D deficiency 06/26/2019    Medication List:  Allergies as of 08/13/2019      Reactions   Codeine    Morphine And Related    Latex    Makes patient skin peal off   Sulfa Antibiotics    coma      Medication List       Accurate as of August 13, 2019 11:59 PM. If you have any questions, ask your nurse or doctor.        Armodafinil 200 MG Tabs Take 1 tablet by mouth daily. Patient states 250 MG daily   Belbuca 600 MCG Film Generic drug: Buprenorphine HCl in the morning and at bedtime.   clonazePAM 1 MG tablet Commonly known as: KLONOPIN 3 (three) times daily as needed.   clorazepate 15 MG tablet Commonly known as: TRANXENE Take 15 mg by mouth daily as needed for anxiety.   divalproex 500 MG DR tablet Commonly known as: DEPAKOTE Take 500 mg by mouth 2 (two) times daily. Take 1 tab (500 mg) in the am and Take 2 tabs (1000 mg) at bedtime   doxepin 75 MG capsule Commonly known as: SINEQUAN Take 75-150 mg by mouth at bedtime as needed (sleep).   DULoxetine 60 MG  capsule Commonly known as: CYMBALTA duloxetine 60 mg capsule,delayed release   fentaNYL 12 MCG/HR Commonly known as: DURAGESIC Place 12.5 mcg onto the skin every other day.   fentaNYL 25 MCG/HR Commonly known as: DURAGESIC Place 25 mcg onto the skin every other day.   fish oil-omega-3 fatty acids 1000 MG capsule Take 1 g by mouth daily.   fluticasone 50 MCG/ACT nasal spray Commonly known as: FLONASE fluticasone propionate 50 mcg/actuation nasal spray,suspension   folic acid 1 MG tablet Commonly known as: FOLVITE Take 1 mg by mouth daily.   HYDROmorphone 4 MG tablet Commonly known as: DILAUDID Take 4 mg by mouth at bedtime.   lamoTRIgine 150 MG tablet Commonly  known as: LAMICTAL Take 150 mg by mouth at bedtime.   Larin Fe 1.5/30 1.5-30 MG-MCG tablet Generic drug: norethindrone-ethinyl estradiol-iron Take 1 tablet by mouth daily.   metFORMIN 500 MG tablet Commonly known as: GLUCOPHAGE Take 500 mg by mouth daily.   naproxen 500 MG tablet Commonly known as: Naprosyn Take 1 tablet (500 mg total) by mouth 2 (two) times daily with a meal.   Narcan 4 MG/0.1ML Liqd nasal spray kit Generic drug: naloxone SMARTSIG:1 Spray(s) Both Nares Once PRN   ondansetron 4 MG tablet Commonly known as: ZOFRAN Take 4 mg by mouth every 8 (eight) hours as needed for nausea.   oxymorphone 5 MG tablet Commonly known as: OPANA Take 5 mg by mouth at bedtime.   oxymorphone 10 MG tablet Commonly known as: OPANA Take 10 mg by mouth every 6 (six) hours as needed for pain.   Ubrelvy 100 MG Tabs Generic drug: Ubrogepant Ubrelvy 100 mg tablet   venlafaxine XR 150 MG 24 hr capsule Commonly known as: EFFEXOR-XR Take 300 mg by mouth at bedtime.   Vitamin D (Ergocalciferol) 1.25 MG (50000 UNIT) Caps capsule Commonly known as: DRISDOL Take 50,000 Units by mouth every 7 (seven) days.   zaleplon 10 MG capsule Commonly known as: SONATA Take 10 mg by mouth at bedtime as needed.     zonisamide 100 MG capsule Commonly known as: ZONEGRAN Take 100 mg by mouth at bedtime.       Birth History: non-contributory  Developmental History: non-contributory  Past Surgical History: Past Surgical History:  Procedure Laterality Date  . CHOLECYSTECTOMY    . knee arthroscopic    . WISDOM TOOTH EXTRACTION       Family History: Family History  Problem Relation Age of Onset  . Fibromyalgia Mother   . Sleep apnea Father   . High Cholesterol Father   . Dementia Father   . Hypertension Father   . ADD / ADHD Father      Social History: Anwitha lives at home in an apartment that is 45 years old.  She has playing flooring in the family rooms and carpeting in the bedrooms.  She has electric heating and central cooling.  There are 2 cats inside of the home.  There are no dust mite covers on the bedding.  There is no tobacco exposure.  She is currently on disability for her bipolar depression.  She also developed fibromyalgia after that.  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  Review of Systems  Constitutional: Positive for malaise/fatigue. Negative for chills, fever and weight loss.  HENT: Positive for congestion and sinus pain. Negative for ear discharge, ear pain and sore throat.   Eyes: Negative for pain, discharge and redness.  Respiratory: Negative for cough, sputum production, shortness of breath and wheezing.   Cardiovascular: Negative.  Negative for chest pain and palpitations.  Gastrointestinal: Positive for abdominal pain and nausea. Negative for constipation, diarrhea, heartburn and vomiting.  Musculoskeletal: Positive for back pain, joint pain, myalgias and neck pain.  Skin: Positive for rash. Negative for itching.  Neurological: Negative for dizziness and headaches.  Endo/Heme/Allergies: Positive for environmental allergies. Does not bruise/bleed easily.       Objective:   Blood pressure 132/88,  pulse (!) 102, temperature 98.4 F (36.9 C), temperature source Temporal, resp. rate (!) 22, height 5' 8"  (1.727 m), weight (!) 411 lb 12.8 oz (186.8 kg), SpO2 97 %. Body mass index is 62.61  kg/m.   Physical Exam:   Physical Exam  Constitutional: She appears well-developed.  Obese female. Cooperative with the exam.   HENT:  Head: Normocephalic and atraumatic.  Right Ear: Tympanic membrane, external ear and ear canal normal. No drainage, swelling or tenderness. Tympanic membrane is not injected, not scarred, not erythematous, not retracted and not bulging.  Left Ear: Tympanic membrane, external ear and ear canal normal. No drainage, swelling or tenderness. Tympanic membrane is not injected, not scarred, not erythematous, not retracted and not bulging.  Nose: No mucosal edema, rhinorrhea, nasal deformity or septal deviation. Right sinus exhibits no maxillary sinus tenderness and no frontal sinus tenderness. Left sinus exhibits no maxillary sinus tenderness and no frontal sinus tenderness.  Mouth/Throat: Uvula is midline. Mucous membranes are not pale and not dry.  Eyes: Pupils are equal, round, and reactive to light. Conjunctivae are normal. Right eye exhibits no chemosis and no discharge. Left eye exhibits no chemosis and no discharge. Right conjunctiva is not injected. Left conjunctiva is not injected.  Cardiovascular: Normal rate, regular rhythm and normal heart sounds.  Respiratory: Effort normal and breath sounds normal. No accessory muscle usage. No tachypnea. No respiratory distress. She has no wheezes. She has no rhonchi. She has no rales. She exhibits no tenderness.  GI: There is no abdominal tenderness. There is no rebound and no guarding.  Lymphadenopathy:       Head (right side): No submandibular, no tonsillar and no occipital adenopathy present.       Head (left side): No submandibular, no tonsillar and no occipital adenopathy present.    She has no cervical adenopathy.    Neurological: She is alert.  Skin: No abrasion, no petechiae and no rash noted. Rash is not papular, not vesicular and not urticarial. No erythema. No pallor.  No eczematous or urticarial lesions noted.      Diagnostic studies:   Allergy Studies:     Airborne Adult Perc - 08/13/19 1449    Time Antigen Placed 1449    Allergen Manufacturer Lavella Hammock    Location Back    Number of Test 59    Panel 1 Select    1. Control-Buffer 50% Glycerol Negative    2. Control-Histamine 1 mg/ml 2+    3. Albumin saline Negative    4. [place] Negative    5. [place] Negative    6. [place] Negative    7. [place] Blue Negative    8. Meadow Fescue Negative    9. Perennial Rye Negative    10. Sweet Vernal Negative    11. Timothy Negative    12. Cocklebur Negative    13. Burweed Marshelder Negative    14. Ragweed, short Negative    15. Ragweed, Giant Negative    16. Plantain,  English Negative    17. Lamb's Quarters Negative    18. Sheep Sorrell Negative    19. Rough Pigweed Negative    20. Marsh Elder, Rough Negative    21. Mugwort, Common Negative    22. Ash mix Negative    23. Birch mix Negative    24. Beech American Negative    25. Box, Elder Negative    26. Cedar, red Negative    27. Cottonwood, [place] Negative    28. Elm mix Negative    29. Hickory Negative    30. Maple mix Negative    31. Oak, [place] mix Negative    32. Pecan Pollen Negative    33. Pine mix Negative  [place] Negative    35. [place], Black Pollen Negative    36. Alternaria alternata Negative    37. Cladosporium Herbarum Negative    38. Aspergillus mix Negative    39. Penicillium mix Negative    40. Bipolaris sorokiniana (Helminthosporium) Negative    41. Drechslera spicifera (Curvularia) Negative    42. Mucor plumbeus Negative    43. Fusarium moniliforme Negative    44. Aureobasidium pullulans (pullulara) Negative    45. Rhizopus oryzae Negative    46. Botrytis cinera Negative    47. Epicoccum  nigrum Negative    48. Phoma betae Negative    49. Candida Albicans Negative    50. Trichophyton mentagrophytes Negative    51. Mite, D Farinae  5,000 AU/ml Negative    52. Mite, D Pteronyssinus  5,000 AU/ml Negative    53. Cat Hair 10,000 BAU/ml Negative    54.  Dog Epithelia Negative    55. Mixed Feathers Negative    56. Horse Epithelia Negative    57. Cockroach, German Negative    58. Mouse Negative    59. Tobacco Leaf Negative          Food Perc - 08/13/19 1450      Test Information   Time Antigen Placed 1450    Allergen Manufacturer Lavella Hammock    Location Back    Number of allergen test 10    Food Select      Food   1. Peanut Negative    2. Soybean food Negative    3. Wheat, whole Negative    4. Sesame Negative    5. Milk, cow Negative    6. Egg White, chicken Negative    7. Casein Negative    8. Shellfish mix Negative    9. Fish mix Negative    10. Cashew Negative           Allergy testing results were read and interpreted by myself, documented by clinical staff.         Salvatore Marvel, MD [facility]

## 2019-08-14 ENCOUNTER — Encounter: Payer: Self-pay | Admitting: Allergy & Immunology

## 2019-08-14 DIAGNOSIS — Z79891 Long term (current) use of opiate analgesic: Secondary | ICD-10-CM | POA: Diagnosis not present

## 2019-08-14 DIAGNOSIS — M2550 Pain in unspecified joint: Secondary | ICD-10-CM | POA: Diagnosis not present

## 2019-08-14 DIAGNOSIS — M797 Fibromyalgia: Secondary | ICD-10-CM | POA: Diagnosis not present

## 2019-08-14 DIAGNOSIS — G43109 Migraine with aura, not intractable, without status migrainosus: Secondary | ICD-10-CM | POA: Diagnosis not present

## 2019-08-14 DIAGNOSIS — M545 Low back pain: Secondary | ICD-10-CM | POA: Diagnosis not present

## 2019-08-14 DIAGNOSIS — M25569 Pain in unspecified knee: Secondary | ICD-10-CM | POA: Diagnosis not present

## 2019-08-14 DIAGNOSIS — G894 Chronic pain syndrome: Secondary | ICD-10-CM | POA: Diagnosis not present

## 2019-08-14 DIAGNOSIS — E282 Polycystic ovarian syndrome: Secondary | ICD-10-CM | POA: Diagnosis not present

## 2019-08-18 LAB — IGE+ALLERGENS ZONE 2(30)

## 2019-08-18 LAB — ALLERGEN PROFILE, MOLD
Aureobasidi Pullulans IgE: <0.1 kU/L
Candida Albicans IgE: <0.1 kU/L
M009-IgE Fusarium proliferatum: <0.1 kU/L
M014-IgE Epicoccum purpur: <0.1 kU/L
Phoma Betae IgE: <0.1 kU/L
Setomelanomma Rostrat: <0.1 kU/L

## 2019-08-18 LAB — ALLERGEN SOYBEAN: Soybean IgE: <0.1 kU/L

## 2019-08-18 LAB — TRYPTASE: Tryptase: 7.5 ug/L (ref 2.2–13.2)

## 2019-08-21 DIAGNOSIS — F310 Bipolar disorder, current episode hypomanic: Secondary | ICD-10-CM | POA: Diagnosis not present

## 2019-09-05 ENCOUNTER — Ambulatory Visit: Payer: PPO

## 2019-09-11 DIAGNOSIS — F310 Bipolar disorder, current episode hypomanic: Secondary | ICD-10-CM | POA: Diagnosis not present

## 2019-09-19 DIAGNOSIS — F3130 Bipolar disorder, current episode depressed, mild or moderate severity, unspecified: Secondary | ICD-10-CM | POA: Diagnosis not present

## 2019-09-19 DIAGNOSIS — G4730 Sleep apnea, unspecified: Secondary | ICD-10-CM | POA: Diagnosis not present

## 2019-09-19 DIAGNOSIS — B0223 Postherpetic polyneuropathy: Secondary | ICD-10-CM | POA: Diagnosis not present

## 2019-09-19 DIAGNOSIS — F4323 Adjustment disorder with mixed anxiety and depressed mood: Secondary | ICD-10-CM | POA: Diagnosis not present

## 2019-10-01 DIAGNOSIS — F310 Bipolar disorder, current episode hypomanic: Secondary | ICD-10-CM | POA: Diagnosis not present

## 2019-10-22 DIAGNOSIS — F310 Bipolar disorder, current episode hypomanic: Secondary | ICD-10-CM | POA: Diagnosis not present

## 2019-10-26 DIAGNOSIS — G4733 Obstructive sleep apnea (adult) (pediatric): Secondary | ICD-10-CM | POA: Diagnosis not present

## 2019-10-31 DIAGNOSIS — F310 Bipolar disorder, current episode hypomanic: Secondary | ICD-10-CM | POA: Diagnosis not present

## 2019-11-07 DIAGNOSIS — F310 Bipolar disorder, current episode hypomanic: Secondary | ICD-10-CM | POA: Diagnosis not present

## 2019-11-12 DIAGNOSIS — Z79891 Long term (current) use of opiate analgesic: Secondary | ICD-10-CM | POA: Diagnosis not present

## 2019-11-12 DIAGNOSIS — E282 Polycystic ovarian syndrome: Secondary | ICD-10-CM | POA: Diagnosis not present

## 2019-11-12 DIAGNOSIS — G43109 Migraine with aura, not intractable, without status migrainosus: Secondary | ICD-10-CM | POA: Diagnosis not present

## 2019-11-12 DIAGNOSIS — M797 Fibromyalgia: Secondary | ICD-10-CM | POA: Diagnosis not present

## 2019-11-12 DIAGNOSIS — G894 Chronic pain syndrome: Secondary | ICD-10-CM | POA: Diagnosis not present

## 2019-11-12 DIAGNOSIS — Z79899 Other long term (current) drug therapy: Secondary | ICD-10-CM | POA: Diagnosis not present

## 2019-11-12 DIAGNOSIS — M545 Low back pain: Secondary | ICD-10-CM | POA: Diagnosis not present

## 2019-11-12 DIAGNOSIS — M2550 Pain in unspecified joint: Secondary | ICD-10-CM | POA: Diagnosis not present

## 2019-11-12 DIAGNOSIS — M25569 Pain in unspecified knee: Secondary | ICD-10-CM | POA: Diagnosis not present

## 2019-11-13 DIAGNOSIS — F4323 Adjustment disorder with mixed anxiety and depressed mood: Secondary | ICD-10-CM | POA: Diagnosis not present

## 2019-11-21 ENCOUNTER — Ambulatory Visit (INDEPENDENT_AMBULATORY_CARE_PROVIDER_SITE_OTHER): Payer: PPO | Admitting: Allergy & Immunology

## 2019-11-21 ENCOUNTER — Encounter: Payer: Self-pay | Admitting: Allergy & Immunology

## 2019-11-21 ENCOUNTER — Other Ambulatory Visit: Payer: Self-pay

## 2019-11-21 DIAGNOSIS — J310 Chronic rhinitis: Secondary | ICD-10-CM | POA: Diagnosis not present

## 2019-11-21 DIAGNOSIS — R519 Headache, unspecified: Secondary | ICD-10-CM

## 2019-11-21 NOTE — Progress Notes (Signed)
RE: Krystal French MRN: [id number] DOB: [birth year] Date of Telemedicine Visit: 11/21/2019  Referring provider: Ephriam Jenkins, PA Primary care provider: Ephriam Jenkins, PA  Chief Complaint: Allergic Rhinitis  (discuss labs. she has been having more migraines, different cause, since last visit. )   Telemedicine Follow Up Visit via Telephone: I connected with Krystal French for a follow up on 11/24/19 by telephone and verified that I am speaking with the correct person using two identifiers.   I discussed the limitations, risks, security and privacy concerns of performing an evaluation and management service by telephone and the availability of in person appointments. I also discussed with the patient that there may be a patient responsible charge related to this service. The patient expressed understanding and agreed to proceed.  Patient is at home.  Provider is at the office.  Visit start time: 3:05 PM Visit end time: 3:25 PM Insurance consent/check in by: [facility] consent and medical assistant/nurse: Kayla  History of Present Illness:  She is a 44 y.o. female, who is being followed for chronic rhinitis as well as food intolerances. Her previous allergy office visit was in June 2021 with myself.  She was last seen as a new patient in June 2021.  At that time, she was negative to the entire panel.  We got some blood work to confirm this.  We started her on Zyrtec as well as nasal saline rinses as needed.  He has a history of atrial intolerance ECG was negative to the most common foods.  We did get blood work to soy to confirm.  Since the last visit, she has had an increase in her migraines. She has had these off and on since puberty, if not before. The first big one that she remembers was at age 28 or so. She started treatment for PCOS with metformin and an OCP with her PCP. Since that time, her migraines have increased but she also tends to be triggered by changes in the barometric  pressure. She is going through some stressful times as well.   She is not having a hard time regards to her rhinitis. They have moved into a newer apartment. The lack of carpeting in the new apartment has helped. They have mostly LVP in the apartment complex. She is using the fluticasone on a somewhat regular basis and she uses cetirizine on a more regular basis. Symptoms are much better controlled in this new environment.   Soy triggers her migraines. She is seeing a neurologist for these migraines.   Otherwise, there have been no changes to her past medical history, surgical history, family history, or social history.  Assessment and Plan:  Krystal French is a 44 y.o. female with:  Chronic rhinitis - with negative testing to the entire environmental panel  Food intolerance - with negative testing to the most common foods  History of migraines - establishing care at [facility]  Obstructive sleep apnea - on CPAP  GERD  Complicated past medical history including bipolar depression as well as fibromyalgia  On disability   Krystal French continues to have some rhinitis symptoms.  We did discuss the blood testing as well as her previous skin testing, both of which were negative.  She would like to go on and do the intradermal testing at her earliest convenience, in the hopes that something pops up and she can start allergen immunotherapy.  Her migraines are getting worse, which she attributes to her environmental allergens. She is going to see  Neurology next month, which should also help to control her symptoms.   Diagnostics: None.  Medication List:  Current Outpatient Medications  Medication Sig Dispense Refill  . Armodafinil 200 MG TABS Take 1 tablet by mouth daily. Patient states 250 MG daily    . Buprenorphine HCl (BELBUCA) 600 MCG FILM in the morning and at bedtime.    . clonazePAM (KLONOPIN) 1 MG tablet 3 (three) times daily as needed.    . divalproex (DEPAKOTE) 500 MG DR  tablet Take 500 mg by mouth 2 (two) times daily. Take 1 tab (500 mg) in the am and Take 3 tabs (1000 mg) at bedtime    . doxepin (SINEQUAN) 75 MG capsule Take 75-150 mg by mouth at bedtime as needed (sleep).    . DULoxetine (CYMBALTA) 60 MG capsule duloxetine 60 mg capsule,delayed release    . fish oil-omega-3 fatty acids 1000 MG capsule Take 1 g by mouth daily.    . fluticasone (FLONASE) 50 MCG/ACT nasal spray fluticasone propionate 50 mcg/actuation nasal spray,suspension    . folic acid (FOLVITE) 1 MG tablet Take 1 mg by mouth daily.    Marland Kitchen HYDROmorphone (DILAUDID) 4 MG tablet Take 4 mg by mouth at bedtime.    . lamoTRIgine (LAMICTAL) 150 MG tablet Take 150 mg by mouth at bedtime.    . metFORMIN (GLUCOPHAGE) 500 MG tablet Take 500 mg by mouth daily.    Marland Kitchen NARCAN 4 MG/0.1ML LIQD nasal spray kit SMARTSIG:1 Spray(s) Both Nares Once PRN    . ondansetron (ZOFRAN) 4 MG tablet Take 4 mg by mouth every 8 (eight) hours as needed for nausea.    Marland Kitchen Ubrogepant (UBRELVY) 100 MG TABS Ubrelvy 100 mg tablet    . venlafaxine XR (EFFEXOR-XR) 150 MG 24 hr capsule Take 300 mg by mouth at bedtime.    . Vitamin D, Ergocalciferol, (DRISDOL) 1.25 MG (50000 UNIT) CAPS capsule Take 50,000 Units by mouth every 7 (seven) days.    . zaleplon (SONATA) 10 MG capsule Take 10 mg by mouth at bedtime as needed.    . zonisamide (ZONEGRAN) 100 MG capsule Take 200 mg by mouth at bedtime.     Marland Kitchen LARIN FE 1.5/30 1.5-30 MG-MCG tablet Take 1 tablet by mouth daily. (Patient not taking: Reported on 11/21/2019)     No current facility-administered medications for this visit.   Allergies: Allergies  Allergen Reactions  . Codeine   . Morphine And Related   . Latex     Makes patient skin peal off  . Sulfa Antibiotics     coma   I reviewed her past medical history, social history, family history, and environmental history and no significant changes have been reported from previous visits.  Review of Systems  Constitutional: Negative  for activity change and appetite change.  HENT: Negative for congestion, postnasal drip, rhinorrhea, sinus pressure, sore throat and voice change.   Eyes: Negative for pain, discharge, redness and itching.  Respiratory: Negative for shortness of breath, wheezing and stridor.   Gastrointestinal: Negative for diarrhea, nausea and vomiting.  Musculoskeletal: Negative for arthralgias, joint swelling and myalgias.  Skin: Negative for rash.  Allergic/Immunologic: Positive for environmental allergies. Negative for food allergies.  Neurological: Positive for headaches.    Objective:  Physical exam not obtained as encounter was done via telephone.   Previous notes and tests were reviewed.  I discussed the assessment and treatment plan with the patient. The patient was provided an opportunity to ask questions and all were answered. The  patient agreed with the plan and demonstrated an understanding of the instructions.   The patient was advised to call back or seek an in-person evaluation if the symptoms worsen or if the condition fails to improve as anticipated.  I provided 20 minutes of non-face-to-face time during this encounter.  It was my pleasure to participate in [place] care today. Please feel free to contact me with any questions or concerns.   Sincerely,  Valentina Shaggy, MD

## 2019-11-24 ENCOUNTER — Encounter: Payer: Self-pay | Admitting: Allergy & Immunology

## 2019-11-25 DIAGNOSIS — G4733 Obstructive sleep apnea (adult) (pediatric): Secondary | ICD-10-CM | POA: Diagnosis not present

## 2019-11-26 ENCOUNTER — Telehealth: Payer: Self-pay

## 2019-11-26 ENCOUNTER — Ambulatory Visit: Payer: PPO | Admitting: Family Medicine

## 2019-11-26 NOTE — Progress Notes (Deleted)
   [address] [place] [address] Dept: [phone]  FOLLOW UP NOTE  Patient ID: Krystal French, female    DOB: October 17, 1975  Age: 44 y.o. MRN: [id number] Date of Office Visit: 11/26/2019  Assessment  Chief Complaint: No chief complaint on file.  HPI Krystal French    Drug Allergies:  Allergies  Allergen Reactions  . Codeine   . Morphine And Related   . Latex     Makes patient skin peal off  . Sulfa Antibiotics     coma    Physical Exam: There were no vitals taken for this visit.   Physical Exam  Diagnostics:    Assessment and Plan: No diagnosis found.  No orders of the defined types were placed in this encounter.   There are no Patient Instructions on file for this visit.  No follow-ups on file.    Thank you for the opportunity to care for this patient.  Please do not hesitate to contact me with questions.  Gareth Morgan, FNP [facility] [place] [place]

## 2019-12-04 ENCOUNTER — Ambulatory Visit: Payer: PPO | Admitting: Neurology

## 2019-12-20 ENCOUNTER — Ambulatory Visit: Payer: PPO | Admitting: Family

## 2019-12-25 DIAGNOSIS — F900 Attention-deficit hyperactivity disorder, predominantly inattentive type: Secondary | ICD-10-CM | POA: Diagnosis not present

## 2019-12-25 DIAGNOSIS — G4730 Sleep apnea, unspecified: Secondary | ICD-10-CM | POA: Diagnosis not present

## 2019-12-25 DIAGNOSIS — F310 Bipolar disorder, current episode hypomanic: Secondary | ICD-10-CM | POA: Diagnosis not present

## 2019-12-25 DIAGNOSIS — G4733 Obstructive sleep apnea (adult) (pediatric): Secondary | ICD-10-CM | POA: Diagnosis not present

## 2019-12-26 DIAGNOSIS — F310 Bipolar disorder, current episode hypomanic: Secondary | ICD-10-CM | POA: Diagnosis not present

## 2020-01-09 DIAGNOSIS — F310 Bipolar disorder, current episode hypomanic: Secondary | ICD-10-CM | POA: Diagnosis not present

## 2020-02-05 ENCOUNTER — Ambulatory Visit: Payer: PPO | Admitting: Neurology

## 2020-02-05 NOTE — Progress Notes (Deleted)
[facility]    Provider:  Dr Jaynee Eagles Requesting Provider: Ephriam Jenkins, PA Primary Care Provider:  Ephriam Jenkins, PA  CC:  ***  HPI:  Krystal French is a 44 y.o. female here as requested by Ephriam Jenkins, PA for chronic migraines.  Past medical history fibromyalgia, calculus of kidney, migraine, severe obesity, vitamin D deficiency, polycystic ovarian syndrome, sleep apnea, morbid obesity, prediabetes, bipolar disorder, mixed hyperlipidemia, her BMI is 50+, weight is up significantly, also chronic pain managed with narcotics, she was supposed to mail an [place] card so that they could check her compliance with CPAP.  Reviewed notes, labs and imaging from outside physicians, which showed *** Labs include normal free T4 in January 2021.  I reviewed Krystal French's notes: History of morbid obesity, chronic pain, obstructive sleep apnea under the care of [facility], joint pain and an ANA positive in the past, follows up with pain specialist for fibromyalgia, last hemoglobin A1c was 6.2, TSH was slightly elevated at 5.35 but free T4 was normal, CMP was unremarkable with BUN 7 and creatinine 0.97, CBC showed low MCV of 80, white blood cells 15.2, RPR was nonreactive HIV was nonreactive vitamin D was 46.9 these were all taken in January 2021.  Review of Systems: Patient complains of symptoms per HPI as well as the following symptoms ***. Pertinent negatives and positives per HPI. All others negative.   Social History   Socioeconomic History  . Marital status: Married    Spouse name: Not on file  . Number of children: 0  . Years of education: Not on file  . Highest education level: Not on file  Occupational History  . Not on file  Tobacco Use  . Smoking status: Former Smoker    Packs/day: 0.50    Years: 2.00    Pack years: 1.00    Types: Cigarettes    Quit date: 1996    Years since quitting: 25.9  . Smokeless tobacco: Never Used  Vaping Use  . Vaping Use: Never used   Substance and Sexual Activity  . Alcohol use: No  . Drug use: No    Comment: quit in 2006  . Sexual activity: Not Currently  Other Topics Concern  . Not on file  Social History Narrative  . Not on file   Social Determinants of Health   Financial Resource Strain:   . Difficulty of Paying Living Expenses: Not on file  Food Insecurity:   . Worried About Charity fundraiser in the Last Year: Not on file  . Ran Out of Food in the Last Year: Not on file  Transportation Needs:   . Lack of Transportation (Medical): Not on file  . Lack of Transportation (Non-Medical): Not on file  Physical Activity:   . Days of Exercise per Week: Not on file  . Minutes of Exercise per Session: Not on file  Stress:   . Feeling of Stress : Not on file  Social Connections:   . Frequency of Communication with Friends and Family: Not on file  . Frequency of Social Gatherings with Friends and Family: Not on file  . Attends Religious Services: Not on file  . Active Member of Clubs or Organizations: Not on file  . Attends Archivist Meetings: Not on file  . Marital Status: Not on file  Intimate Partner Violence:   . Fear of Current or Ex-Partner: Not on file  . Emotionally Abused: Not on file  . Physically Abused:  Not on file  . Sexually Abused: Not on file    Family History  Problem Relation Age of Onset  . Fibromyalgia Mother   . Sleep apnea Father   . High Cholesterol Father   . Dementia Father   . Hypertension Father   . ADD / ADHD Father     Past Medical History:  Diagnosis Date  . Anxiety   . Bipolar 2 disorder ([place])   . Fibromyalgia   . Migraine   . Morbid obesity ([place])   . Neuropathy   . Polycystic ovaries   . Sleep apnea     Patient Active Problem List   Diagnosis Date Noted  . History of PCOS 06/26/2019  . History of bipolar disorder 06/26/2019  . Hx of esophageal reflux 06/26/2019  . History of hyperlipidemia 06/26/2019  . History of kidney stones 06/26/2019   . Sleep apnea with use of continuous positive airway pressure (CPAP) 06/26/2019  . Other insomnia 06/26/2019  . Hx of migraines 06/26/2019  . Vitamin D deficiency 06/26/2019    Past Surgical History:  Procedure Laterality Date  . CHOLECYSTECTOMY    . knee arthroscopic    . WISDOM TOOTH EXTRACTION      Current Outpatient Medications  Medication Sig Dispense Refill  . Armodafinil 200 MG TABS Take 1 tablet by mouth daily. Patient states 250 MG daily    . Buprenorphine HCl (BELBUCA) 600 MCG FILM in the morning and at bedtime.    . clonazePAM (KLONOPIN) 1 MG tablet 3 (three) times daily as needed.    . divalproex (DEPAKOTE) 500 MG DR tablet Take 500 mg by mouth 2 (two) times daily. Take 1 tab (500 mg) in the am and Take 3 tabs (1000 mg) at bedtime    . doxepin (SINEQUAN) 75 MG capsule Take 75-150 mg by mouth at bedtime as needed (sleep).    . DULoxetine (CYMBALTA) 60 MG capsule duloxetine 60 mg capsule,delayed release    . fish oil-omega-3 fatty acids 1000 MG capsule Take 1 g by mouth daily.    . fluticasone (FLONASE) 50 MCG/ACT nasal spray fluticasone propionate 50 mcg/actuation nasal spray,suspension    . folic acid (FOLVITE) 1 MG tablet Take 1 mg by mouth daily.    Marland Kitchen HYDROmorphone (DILAUDID) 4 MG tablet Take 4 mg by mouth at bedtime.    . lamoTRIgine (LAMICTAL) 150 MG tablet Take 150 mg by mouth at bedtime.    Marland Kitchen LARIN FE 1.5/30 1.5-30 MG-MCG tablet Take 1 tablet by mouth daily. (Patient not taking: Reported on 11/21/2019)    . metFORMIN (GLUCOPHAGE) 500 MG tablet Take 500 mg by mouth daily.    Marland Kitchen NARCAN 4 MG/0.1ML LIQD nasal spray kit SMARTSIG:1 Spray(s) Both Nares Once PRN    . ondansetron (ZOFRAN) 4 MG tablet Take 4 mg by mouth every 8 (eight) hours as needed for nausea.    Marland Kitchen Ubrogepant (UBRELVY) 100 MG TABS Ubrelvy 100 mg tablet    . venlafaxine XR (EFFEXOR-XR) 150 MG 24 hr capsule Take 300 mg by mouth at bedtime.    . Vitamin D, Ergocalciferol, (DRISDOL) 1.25 MG (50000 UNIT) CAPS  capsule Take 50,000 Units by mouth every 7 (seven) days.    . zaleplon (SONATA) 10 MG capsule Take 10 mg by mouth at bedtime as needed.    . zonisamide (ZONEGRAN) 100 MG capsule Take 200 mg by mouth at bedtime.      No current facility-administered medications for this visit.    Allergies as of 02/05/2020 -  Review Complete 11/24/2019  Allergen Reaction Noted  . Codeine  10/22/2012  . Morphine and related  10/22/2012  . Latex  10/02/2011  . Sulfa antibiotics  10/02/2011    Vitals: There were no vitals taken for this visit. Last Weight:  Wt Readings from Last 1 Encounters:  08/13/19 (!) 411 lb 12.8 oz (186.8 kg)   Last Height:   Ht Readings from Last 1 Encounters:  08/13/19 _0  (1.727 m)     Physical exam: Exam: Gen: NAD, conversant, well nourised, obese, well groomed                     CV: RRR, no MRG. No Carotid Bruits. No peripheral edema, warm, nontender Eyes: Conjunctivae clear without exudates or hemorrhage  Neuro: Detailed Neurologic Exam  Speech:    Speech is normal; fluent and spontaneous with normal comprehension.  Cognition:    The patient is oriented to person, place, and time;     recent and remote memory intact;     language fluent;     normal attention, concentration,     fund of knowledge Cranial Nerves:    The pupils are equal, round, and reactive to light. The fundi are normal and spontaneous venous pulsations are present. Visual fields are full to finger confrontation. Extraocular movements are intact. Trigeminal sensation is intact and the muscles of mastication are normal. The face is symmetric. The palate elevates in the midline. Hearing intact. Voice is normal. Shoulder shrug is normal. The tongue has normal motion without fasciculations.   Coordination:    Normal finger to nose and heel to shin. Normal rapid alternating movements.   Gait:    Heel-toe and tandem gait are normal.   Motor Observation:    No asymmetry, no atrophy, and no  involuntary movements noted. Tone:    Normal muscle tone.    Posture:    Posture is normal. normal erect    Strength:    Strength is V/V in the upper and lower limbs.      Sensation: intact to LT     Reflex Exam:  DTR's:    Deep tendon reflexes in the upper and lower extremities are normal bilaterally.   Toes:    The toes are downgoing bilaterally.   Clonus:    Clonus is absent.    Assessment/Plan:    No orders of the defined types were placed in this encounter.  No orders of the defined types were placed in this encounter.   Cc: Ephriam Jenkins, PA,  Ephriam Jenkins, [place]  Sarina Ill, MD  [facility] [address] [place], [place] [address]  Phone [phone] Fax [phone]

## 2020-02-06 ENCOUNTER — Encounter: Payer: Self-pay | Admitting: Neurology

## 2020-02-07 DIAGNOSIS — Z03818 Encounter for observation for suspected exposure to other biological agents ruled out: Secondary | ICD-10-CM | POA: Diagnosis not present

## 2020-02-07 DIAGNOSIS — R509 Fever, unspecified: Secondary | ICD-10-CM | POA: Diagnosis not present

## 2020-02-11 DIAGNOSIS — F310 Bipolar disorder, current episode hypomanic: Secondary | ICD-10-CM | POA: Diagnosis not present

## 2020-02-13 DIAGNOSIS — M1712 Unilateral primary osteoarthritis, left knee: Secondary | ICD-10-CM | POA: Diagnosis not present

## 2020-02-13 DIAGNOSIS — M25562 Pain in left knee: Secondary | ICD-10-CM | POA: Diagnosis not present

## 2020-02-17 DIAGNOSIS — M2550 Pain in unspecified joint: Secondary | ICD-10-CM | POA: Diagnosis not present

## 2020-02-17 DIAGNOSIS — M797 Fibromyalgia: Secondary | ICD-10-CM | POA: Diagnosis not present

## 2020-02-17 DIAGNOSIS — E282 Polycystic ovarian syndrome: Secondary | ICD-10-CM | POA: Diagnosis not present

## 2020-02-17 DIAGNOSIS — G894 Chronic pain syndrome: Secondary | ICD-10-CM | POA: Diagnosis not present

## 2020-02-17 DIAGNOSIS — G43109 Migraine with aura, not intractable, without status migrainosus: Secondary | ICD-10-CM | POA: Diagnosis not present

## 2020-02-17 DIAGNOSIS — Z79891 Long term (current) use of opiate analgesic: Secondary | ICD-10-CM | POA: Diagnosis not present

## 2020-02-17 DIAGNOSIS — M25569 Pain in unspecified knee: Secondary | ICD-10-CM | POA: Diagnosis not present

## 2020-02-17 DIAGNOSIS — Z79899 Other long term (current) drug therapy: Secondary | ICD-10-CM | POA: Diagnosis not present

## 2020-02-18 DIAGNOSIS — G4733 Obstructive sleep apnea (adult) (pediatric): Secondary | ICD-10-CM | POA: Diagnosis not present

## 2020-03-03 DIAGNOSIS — F310 Bipolar disorder, current episode hypomanic: Secondary | ICD-10-CM | POA: Diagnosis not present

## 2020-03-03 DIAGNOSIS — H93293 Other abnormal auditory perceptions, bilateral: Secondary | ICD-10-CM | POA: Diagnosis not present

## 2020-03-03 DIAGNOSIS — R42 Dizziness and giddiness: Secondary | ICD-10-CM | POA: Diagnosis not present

## 2020-03-17 DIAGNOSIS — F3131 Bipolar disorder, current episode depressed, mild: Secondary | ICD-10-CM | POA: Diagnosis not present

## 2020-03-17 DIAGNOSIS — G4730 Sleep apnea, unspecified: Secondary | ICD-10-CM | POA: Diagnosis not present

## 2020-03-17 DIAGNOSIS — F900 Attention-deficit hyperactivity disorder, predominantly inattentive type: Secondary | ICD-10-CM | POA: Diagnosis not present

## 2020-03-17 DIAGNOSIS — B0223 Postherpetic polyneuropathy: Secondary | ICD-10-CM | POA: Diagnosis not present

## 2020-03-19 DIAGNOSIS — F310 Bipolar disorder, current episode hypomanic: Secondary | ICD-10-CM | POA: Diagnosis not present

## 2020-03-27 DIAGNOSIS — G4733 Obstructive sleep apnea (adult) (pediatric): Secondary | ICD-10-CM | POA: Diagnosis not present

## 2020-03-31 DIAGNOSIS — F310 Bipolar disorder, current episode hypomanic: Secondary | ICD-10-CM | POA: Diagnosis not present

## 2020-04-08 NOTE — Telephone Encounter (Signed)
Called to try to reschedule;e her missed appointment for intradermal testing. No answer. Phone rang but didn't go to voice mail.

## 2020-04-22 ENCOUNTER — Ambulatory Visit: Payer: PPO | Admitting: Neurology

## 2020-05-05 DIAGNOSIS — F900 Attention-deficit hyperactivity disorder, predominantly inattentive type: Secondary | ICD-10-CM | POA: Diagnosis not present

## 2020-05-05 DIAGNOSIS — G4730 Sleep apnea, unspecified: Secondary | ICD-10-CM | POA: Diagnosis not present

## 2020-05-05 DIAGNOSIS — F3131 Bipolar disorder, current episode depressed, mild: Secondary | ICD-10-CM | POA: Diagnosis not present

## 2020-05-06 DIAGNOSIS — G4733 Obstructive sleep apnea (adult) (pediatric): Secondary | ICD-10-CM | POA: Diagnosis not present

## 2020-05-11 DIAGNOSIS — E282 Polycystic ovarian syndrome: Secondary | ICD-10-CM | POA: Diagnosis not present

## 2020-05-11 DIAGNOSIS — G894 Chronic pain syndrome: Secondary | ICD-10-CM | POA: Diagnosis not present

## 2020-05-11 DIAGNOSIS — M25569 Pain in unspecified knee: Secondary | ICD-10-CM | POA: Diagnosis not present

## 2020-05-11 DIAGNOSIS — G43109 Migraine with aura, not intractable, without status migrainosus: Secondary | ICD-10-CM | POA: Diagnosis not present

## 2020-05-11 DIAGNOSIS — K5903 Drug induced constipation: Secondary | ICD-10-CM | POA: Diagnosis not present

## 2020-05-11 DIAGNOSIS — M797 Fibromyalgia: Secondary | ICD-10-CM | POA: Diagnosis not present

## 2020-05-11 DIAGNOSIS — M2550 Pain in unspecified joint: Secondary | ICD-10-CM | POA: Diagnosis not present

## 2020-05-11 DIAGNOSIS — Z79899 Other long term (current) drug therapy: Secondary | ICD-10-CM | POA: Diagnosis not present

## 2020-05-11 DIAGNOSIS — Z79891 Long term (current) use of opiate analgesic: Secondary | ICD-10-CM | POA: Diagnosis not present

## 2020-06-09 DIAGNOSIS — F310 Bipolar disorder, current episode hypomanic: Secondary | ICD-10-CM | POA: Diagnosis not present

## 2020-06-24 DIAGNOSIS — F310 Bipolar disorder, current episode hypomanic: Secondary | ICD-10-CM | POA: Diagnosis not present

## 2020-06-30 DIAGNOSIS — F310 Bipolar disorder, current episode hypomanic: Secondary | ICD-10-CM | POA: Diagnosis not present

## 2020-07-21 DIAGNOSIS — G4733 Obstructive sleep apnea (adult) (pediatric): Secondary | ICD-10-CM | POA: Diagnosis not present

## 2020-08-03 DIAGNOSIS — M2550 Pain in unspecified joint: Secondary | ICD-10-CM | POA: Diagnosis not present

## 2020-08-03 DIAGNOSIS — E282 Polycystic ovarian syndrome: Secondary | ICD-10-CM | POA: Diagnosis not present

## 2020-08-03 DIAGNOSIS — G43109 Migraine with aura, not intractable, without status migrainosus: Secondary | ICD-10-CM | POA: Diagnosis not present

## 2020-08-03 DIAGNOSIS — K5903 Drug induced constipation: Secondary | ICD-10-CM | POA: Diagnosis not present

## 2020-08-03 DIAGNOSIS — Z79891 Long term (current) use of opiate analgesic: Secondary | ICD-10-CM | POA: Diagnosis not present

## 2020-08-03 DIAGNOSIS — G894 Chronic pain syndrome: Secondary | ICD-10-CM | POA: Diagnosis not present

## 2020-08-03 DIAGNOSIS — M25569 Pain in unspecified knee: Secondary | ICD-10-CM | POA: Diagnosis not present

## 2020-08-03 DIAGNOSIS — Z79899 Other long term (current) drug therapy: Secondary | ICD-10-CM | POA: Diagnosis not present

## 2020-08-03 DIAGNOSIS — M797 Fibromyalgia: Secondary | ICD-10-CM | POA: Diagnosis not present

## 2020-08-27 DIAGNOSIS — G4730 Sleep apnea, unspecified: Secondary | ICD-10-CM | POA: Diagnosis not present

## 2020-08-27 DIAGNOSIS — F3112 Bipolar disorder, current episode manic without psychotic features, moderate: Secondary | ICD-10-CM | POA: Diagnosis not present

## 2020-08-27 DIAGNOSIS — F900 Attention-deficit hyperactivity disorder, predominantly inattentive type: Secondary | ICD-10-CM | POA: Diagnosis not present

## 2020-08-30 DIAGNOSIS — N898 Other specified noninflammatory disorders of vagina: Secondary | ICD-10-CM | POA: Diagnosis not present

## 2020-08-30 DIAGNOSIS — M549 Dorsalgia, unspecified: Secondary | ICD-10-CM | POA: Diagnosis not present

## 2020-09-02 DIAGNOSIS — G4733 Obstructive sleep apnea (adult) (pediatric): Secondary | ICD-10-CM | POA: Diagnosis not present

## 2020-10-02 DIAGNOSIS — G4733 Obstructive sleep apnea (adult) (pediatric): Secondary | ICD-10-CM | POA: Diagnosis not present

## 2020-11-04 DIAGNOSIS — G4733 Obstructive sleep apnea (adult) (pediatric): Secondary | ICD-10-CM | POA: Diagnosis not present

## 2020-11-16 DIAGNOSIS — G43109 Migraine with aura, not intractable, without status migrainosus: Secondary | ICD-10-CM | POA: Diagnosis not present

## 2020-11-16 DIAGNOSIS — M25569 Pain in unspecified knee: Secondary | ICD-10-CM | POA: Diagnosis not present

## 2020-11-16 DIAGNOSIS — K5903 Drug induced constipation: Secondary | ICD-10-CM | POA: Diagnosis not present

## 2020-11-16 DIAGNOSIS — M2550 Pain in unspecified joint: Secondary | ICD-10-CM | POA: Diagnosis not present

## 2020-11-16 DIAGNOSIS — Z79899 Other long term (current) drug therapy: Secondary | ICD-10-CM | POA: Diagnosis not present

## 2020-11-16 DIAGNOSIS — E282 Polycystic ovarian syndrome: Secondary | ICD-10-CM | POA: Diagnosis not present

## 2020-11-16 DIAGNOSIS — G894 Chronic pain syndrome: Secondary | ICD-10-CM | POA: Diagnosis not present

## 2020-11-16 DIAGNOSIS — Z79891 Long term (current) use of opiate analgesic: Secondary | ICD-10-CM | POA: Diagnosis not present

## 2020-11-16 DIAGNOSIS — M797 Fibromyalgia: Secondary | ICD-10-CM | POA: Diagnosis not present

## 2020-12-04 DIAGNOSIS — G4733 Obstructive sleep apnea (adult) (pediatric): Secondary | ICD-10-CM | POA: Diagnosis not present

## 2020-12-18 DIAGNOSIS — L659 Nonscarring hair loss, unspecified: Secondary | ICD-10-CM | POA: Diagnosis not present

## 2020-12-18 DIAGNOSIS — E282 Polycystic ovarian syndrome: Secondary | ICD-10-CM | POA: Diagnosis not present

## 2020-12-18 DIAGNOSIS — E038 Other specified hypothyroidism: Secondary | ICD-10-CM | POA: Diagnosis not present

## 2020-12-18 DIAGNOSIS — Z6841 Body Mass Index (BMI) 40.0 and over, adult: Secondary | ICD-10-CM | POA: Diagnosis not present

## 2020-12-18 DIAGNOSIS — R6889 Other general symptoms and signs: Secondary | ICD-10-CM | POA: Diagnosis not present

## 2020-12-18 DIAGNOSIS — E559 Vitamin D deficiency, unspecified: Secondary | ICD-10-CM | POA: Diagnosis not present

## 2021-01-05 DIAGNOSIS — G4733 Obstructive sleep apnea (adult) (pediatric): Secondary | ICD-10-CM | POA: Diagnosis not present

## 2021-01-08 DIAGNOSIS — N3000 Acute cystitis without hematuria: Secondary | ICD-10-CM | POA: Diagnosis not present

## 2021-01-19 DIAGNOSIS — G4733 Obstructive sleep apnea (adult) (pediatric): Secondary | ICD-10-CM | POA: Diagnosis not present

## 2021-01-26 DIAGNOSIS — R509 Fever, unspecified: Secondary | ICD-10-CM | POA: Diagnosis not present

## 2021-01-26 DIAGNOSIS — J029 Acute pharyngitis, unspecified: Secondary | ICD-10-CM | POA: Diagnosis not present

## 2021-01-26 DIAGNOSIS — U071 COVID-19: Secondary | ICD-10-CM | POA: Diagnosis not present

## 2021-01-26 DIAGNOSIS — R0981 Nasal congestion: Secondary | ICD-10-CM | POA: Diagnosis not present

## 2021-02-02 DIAGNOSIS — G4733 Obstructive sleep apnea (adult) (pediatric): Secondary | ICD-10-CM | POA: Diagnosis not present

## 2021-02-10 DIAGNOSIS — G4730 Sleep apnea, unspecified: Secondary | ICD-10-CM | POA: Diagnosis not present

## 2021-02-10 DIAGNOSIS — F900 Attention-deficit hyperactivity disorder, predominantly inattentive type: Secondary | ICD-10-CM | POA: Diagnosis not present

## 2021-02-10 DIAGNOSIS — F3131 Bipolar disorder, current episode depressed, mild: Secondary | ICD-10-CM | POA: Diagnosis not present

## 2021-02-10 DIAGNOSIS — B0223 Postherpetic polyneuropathy: Secondary | ICD-10-CM | POA: Diagnosis not present

## 2021-02-11 DIAGNOSIS — Z79891 Long term (current) use of opiate analgesic: Secondary | ICD-10-CM | POA: Diagnosis not present

## 2021-02-11 DIAGNOSIS — Z79899 Other long term (current) drug therapy: Secondary | ICD-10-CM | POA: Diagnosis not present

## 2021-02-16 DIAGNOSIS — K5903 Drug induced constipation: Secondary | ICD-10-CM | POA: Diagnosis not present

## 2021-02-16 DIAGNOSIS — E282 Polycystic ovarian syndrome: Secondary | ICD-10-CM | POA: Diagnosis not present

## 2021-02-16 DIAGNOSIS — M2550 Pain in unspecified joint: Secondary | ICD-10-CM | POA: Diagnosis not present

## 2021-02-16 DIAGNOSIS — M25569 Pain in unspecified knee: Secondary | ICD-10-CM | POA: Diagnosis not present

## 2021-02-16 DIAGNOSIS — G894 Chronic pain syndrome: Secondary | ICD-10-CM | POA: Diagnosis not present

## 2021-02-16 DIAGNOSIS — G43109 Migraine with aura, not intractable, without status migrainosus: Secondary | ICD-10-CM | POA: Diagnosis not present

## 2021-02-16 DIAGNOSIS — M797 Fibromyalgia: Secondary | ICD-10-CM | POA: Diagnosis not present

## 2021-02-16 DIAGNOSIS — Z79891 Long term (current) use of opiate analgesic: Secondary | ICD-10-CM | POA: Diagnosis not present

## 2021-03-05 DIAGNOSIS — G4733 Obstructive sleep apnea (adult) (pediatric): Secondary | ICD-10-CM | POA: Diagnosis not present

## 2021-03-17 DIAGNOSIS — G4733 Obstructive sleep apnea (adult) (pediatric): Secondary | ICD-10-CM | POA: Diagnosis not present

## 2021-03-17 DIAGNOSIS — E282 Polycystic ovarian syndrome: Secondary | ICD-10-CM | POA: Diagnosis not present

## 2021-03-17 DIAGNOSIS — M797 Fibromyalgia: Secondary | ICD-10-CM | POA: Diagnosis not present

## 2021-03-17 DIAGNOSIS — E038 Other specified hypothyroidism: Secondary | ICD-10-CM | POA: Diagnosis not present

## 2021-03-17 DIAGNOSIS — G43709 Chronic migraine without aura, not intractable, without status migrainosus: Secondary | ICD-10-CM | POA: Diagnosis not present

## 2021-03-17 DIAGNOSIS — E782 Mixed hyperlipidemia: Secondary | ICD-10-CM | POA: Diagnosis not present

## 2021-03-17 DIAGNOSIS — E559 Vitamin D deficiency, unspecified: Secondary | ICD-10-CM | POA: Diagnosis not present

## 2021-03-17 DIAGNOSIS — Z0000 Encounter for general adult medical examination without abnormal findings: Secondary | ICD-10-CM | POA: Diagnosis not present

## 2021-03-17 DIAGNOSIS — Z6841 Body Mass Index (BMI) 40.0 and over, adult: Secondary | ICD-10-CM | POA: Diagnosis not present

## 2021-03-17 DIAGNOSIS — R7301 Impaired fasting glucose: Secondary | ICD-10-CM | POA: Diagnosis not present

## 2021-03-17 DIAGNOSIS — Z23 Encounter for immunization: Secondary | ICD-10-CM | POA: Diagnosis not present

## 2021-03-25 ENCOUNTER — Encounter: Payer: Self-pay | Admitting: Neurology

## 2021-03-26 DIAGNOSIS — F900 Attention-deficit hyperactivity disorder, predominantly inattentive type: Secondary | ICD-10-CM | POA: Diagnosis not present

## 2021-03-26 DIAGNOSIS — F3112 Bipolar disorder, current episode manic without psychotic features, moderate: Secondary | ICD-10-CM | POA: Diagnosis not present

## 2021-03-26 DIAGNOSIS — G4730 Sleep apnea, unspecified: Secondary | ICD-10-CM | POA: Diagnosis not present

## 2021-03-26 DIAGNOSIS — B0223 Postherpetic polyneuropathy: Secondary | ICD-10-CM | POA: Diagnosis not present

## 2021-04-07 DIAGNOSIS — G4733 Obstructive sleep apnea (adult) (pediatric): Secondary | ICD-10-CM | POA: Diagnosis not present

## 2021-04-16 DIAGNOSIS — M79605 Pain in left leg: Secondary | ICD-10-CM | POA: Diagnosis not present

## 2021-04-16 DIAGNOSIS — M25532 Pain in left wrist: Secondary | ICD-10-CM | POA: Diagnosis not present

## 2021-04-16 DIAGNOSIS — R229 Localized swelling, mass and lump, unspecified: Secondary | ICD-10-CM | POA: Diagnosis not present

## 2021-04-16 DIAGNOSIS — M6740 Ganglion, unspecified site: Secondary | ICD-10-CM | POA: Diagnosis not present

## 2021-04-16 DIAGNOSIS — M79604 Pain in right leg: Secondary | ICD-10-CM | POA: Diagnosis not present

## 2021-04-19 DIAGNOSIS — F3112 Bipolar disorder, current episode manic without psychotic features, moderate: Secondary | ICD-10-CM | POA: Diagnosis not present

## 2021-04-19 DIAGNOSIS — G4730 Sleep apnea, unspecified: Secondary | ICD-10-CM | POA: Diagnosis not present

## 2021-04-19 DIAGNOSIS — B0223 Postherpetic polyneuropathy: Secondary | ICD-10-CM | POA: Diagnosis not present

## 2021-04-19 DIAGNOSIS — F900 Attention-deficit hyperactivity disorder, predominantly inattentive type: Secondary | ICD-10-CM | POA: Diagnosis not present

## 2021-04-23 DIAGNOSIS — F319 Bipolar disorder, unspecified: Secondary | ICD-10-CM | POA: Diagnosis not present

## 2021-04-23 DIAGNOSIS — M25832 Other specified joint disorders, left wrist: Secondary | ICD-10-CM | POA: Diagnosis not present

## 2021-04-23 DIAGNOSIS — G5601 Carpal tunnel syndrome, right upper limb: Secondary | ICD-10-CM | POA: Diagnosis not present

## 2021-04-23 DIAGNOSIS — G4733 Obstructive sleep apnea (adult) (pediatric): Secondary | ICD-10-CM | POA: Diagnosis not present

## 2021-05-05 DIAGNOSIS — M25532 Pain in left wrist: Secondary | ICD-10-CM | POA: Diagnosis not present

## 2021-05-07 DIAGNOSIS — F900 Attention-deficit hyperactivity disorder, predominantly inattentive type: Secondary | ICD-10-CM | POA: Diagnosis not present

## 2021-05-07 DIAGNOSIS — G4730 Sleep apnea, unspecified: Secondary | ICD-10-CM | POA: Diagnosis not present

## 2021-05-07 DIAGNOSIS — F3112 Bipolar disorder, current episode manic without psychotic features, moderate: Secondary | ICD-10-CM | POA: Diagnosis not present

## 2021-05-12 DIAGNOSIS — G4733 Obstructive sleep apnea (adult) (pediatric): Secondary | ICD-10-CM | POA: Diagnosis not present

## 2021-05-13 DIAGNOSIS — E282 Polycystic ovarian syndrome: Secondary | ICD-10-CM | POA: Diagnosis not present

## 2021-05-13 DIAGNOSIS — M25569 Pain in unspecified knee: Secondary | ICD-10-CM | POA: Diagnosis not present

## 2021-05-13 DIAGNOSIS — Z79899 Other long term (current) drug therapy: Secondary | ICD-10-CM | POA: Diagnosis not present

## 2021-05-13 DIAGNOSIS — G894 Chronic pain syndrome: Secondary | ICD-10-CM | POA: Diagnosis not present

## 2021-05-13 DIAGNOSIS — K5903 Drug induced constipation: Secondary | ICD-10-CM | POA: Diagnosis not present

## 2021-05-13 DIAGNOSIS — M797 Fibromyalgia: Secondary | ICD-10-CM | POA: Diagnosis not present

## 2021-05-13 DIAGNOSIS — G43109 Migraine with aura, not intractable, without status migrainosus: Secondary | ICD-10-CM | POA: Diagnosis not present

## 2021-05-13 DIAGNOSIS — M2550 Pain in unspecified joint: Secondary | ICD-10-CM | POA: Diagnosis not present

## 2021-05-13 DIAGNOSIS — Z79891 Long term (current) use of opiate analgesic: Secondary | ICD-10-CM | POA: Diagnosis not present

## 2021-06-09 DIAGNOSIS — G4710 Hypersomnia, unspecified: Secondary | ICD-10-CM | POA: Diagnosis not present

## 2021-06-10 DIAGNOSIS — M25832 Other specified joint disorders, left wrist: Secondary | ICD-10-CM | POA: Diagnosis not present

## 2021-06-11 ENCOUNTER — Ambulatory Visit: Payer: PPO | Admitting: Neurology

## 2021-06-11 NOTE — Progress Notes (Deleted)
? ?NEUROLOGY CONSULTATION NOTE ? ?Krystal French ?MRN: [id number] ?DOB: 10-Oct-1975 ? ?Referring provider: Thayer Ohm, PA-C ?Primary care provider: Benjiman Core, PA-C ? ?Reason for consult:  migraine ? ?Assessment/Plan:  ? ?*** ? ? ?Subjective:  ?Krystal French is a 46 year old female with PCOS, impaired fasting glucose, Bipolar 2 disorder, anxiety, sleep apnea, fibromyalgia and history of kidney stones who presents for migraines.  History supplemented by referring provider's note. ? ?Onset:  *** ?Location:  *** ?Quality:  *** ?Intensity:  ***.  *** denies new headache, thunderclap headache or severe headache that wakes *** from sleep. ?Aura:  *** ?Prodrome:  *** ?Postdrome:  *** ?Associated symptoms:  ***.  *** denies associated unilateral numbness or weakness. ?Duration:  *** ?Frequency:  *** ?Frequency of abortive medication: *** ?Triggers:  *** ?Relieving factors:  *** ?Activity:  *** ? ?CT head on 03/24/2015 personally reviewed was normal. ? ?Current NSAIDS/analgesics:  *** ?Current triptans:  none ?Current ergotamine:  none ?Current anti-emetic:  Zofran 70m ?Current muscle relaxants:  none ?Current Antihypertensive medications:  none ?Current Antidepressant medications:  duloxetine 689mdaily, doxepin 75-15063mHS ?Current Anticonvulsant medications:  zonisamide 100m71mily, Depakote 500mg31m1000mg 75mCurrent anti-CGRP:  none ?Other therapy:  *** ?Hormone/birth control:  estradiol ?Other medications:  clonazepam, zaleplon QHS PRN ? ?Past NSAIDS/analgesics:  naproxen ?Past abortive triptans:  *** ?Past abortive ergotamine:  none ?Past muscle relaxants:  *** ?Past anti-emetic:  *** ?Past antihypertensive medications:  *** ?Past antidepressant medications:  venlafaxine ?Past anticonvulsant medications:  topiramate, lamotrigine ?Past anti-CGRP:  Ubrelvy 100mg ?23mr past therapies:  *** ? ?Caffeine:  *** ?Alcohol:  *** ?Smoker:  *** ?Diet:  *** ?Exercise:  *** ?Depression:  ***; Anxiety:  *** ?Other pain:   *** ?Sleep hygiene:  *** ?Family history of headache:  *** ? ?  ? ? ?PAST MEDICAL HISTORY: ?Past Medical History:  ?Diagnosis Date  ? Anxiety   ? Bipolar 2 disorder ([facility])   Dillonibromyalgia   ? Migraine   ? Morbid obesity ([facility])   Pinsoneuropathy   ? Polycystic ovaries   ? Sleep apnea   ? ? ?PAST SURGICAL HISTORY: ?Past Surgical History:  ?Procedure Laterality Date  ? CHOLECYSTECTOMY    ? knee arthroscopic    ? WISDOM TOOTH EXTRACTION    ? ? ?MEDICATIONS: ?Current Outpatient Medications on File Prior to Visit  ?Medication Sig Dispense Refill  ? Armodafinil 200 MG TABS Take 1 tablet by mouth daily. Patient states 250 MG daily    ? Buprenorphine HCl (BELBUCA) 600 MCG FILM in the morning and at bedtime.    ? clonazePAM (KLONOPIN) 1 MG tablet 3 (three) times daily as needed.    ? divalproex (DEPAKOTE) 500 MG DR tablet Take 500 mg by mouth 2 (two) times daily. Take 1 tab (500 mg) in the am and Take 3 tabs (1000 mg) at bedtime    ? doxepin (SINEQUAN) 75 MG capsule Take 75-150 mg by mouth at bedtime as needed (sleep).    ? DULoxetine (CYMBALTA) 60 MG capsule duloxetine 60 mg capsule,delayed release    ? fish oil-omega-3 fatty acids 1000 MG capsule Take 1 g by mouth daily.    ? fluticasone (FLONASE) 50 MCG/ACT nasal spray fluticasone propionate 50 mcg/actuation nasal spray,suspension    ? folic acid (FOLVITE) 1 MG tablet Take 1 mg by mouth daily.    ? HYDROmorphone (DILAUDID) 4 MG tablet Take 4 mg by mouth at bedtime.    ? lamoTRIgine (LAMICTAL) 150 MG tablet Take  150 mg by mouth at bedtime.    ? LARIN FE 1.5/30 1.5-30 MG-MCG tablet Take 1 tablet by mouth daily. (Patient not taking: Reported on 11/21/2019)    ? metFORMIN (GLUCOPHAGE) 500 MG tablet Take 500 mg by mouth daily.    ? NARCAN 4 MG/0.1ML LIQD nasal spray kit SMARTSIG:1 Spray(s) Both Nares Once PRN    ? ondansetron (ZOFRAN) 4 MG tablet Take 4 mg by mouth every 8 (eight) hours as needed for nausea.    ? Ubrogepant (UBRELVY) 100 MG TABS Ubrelvy 100 mg tablet    ?  venlafaxine XR (EFFEXOR-XR) 150 MG 24 hr capsule Take 300 mg by mouth at bedtime.    ? Vitamin D, Ergocalciferol, (DRISDOL) 1.25 MG (50000 UNIT) CAPS capsule Take 50,000 Units by mouth every 7 (seven) days.    ? zaleplon (SONATA) 10 MG capsule Take 10 mg by mouth at bedtime as needed.    ? zonisamide (ZONEGRAN) 100 MG capsule Take 200 mg by mouth at bedtime.     ? ?No current facility-administered medications on file prior to visit.  ? ? ?ALLERGIES: ?Allergies  ?Allergen Reactions  ? Codeine   ? Morphine And Related   ? Latex   ?  Makes patient skin peal off  ? Sulfa Antibiotics   ?  coma  ? ? ?FAMILY HISTORY: ?Family History  ?Problem Relation Age of Onset  ? Fibromyalgia Mother   ? Sleep apnea Father   ? High Cholesterol Father   ? Dementia Father   ? Hypertension Father   ? ADD / ADHD Father   ? ? ?Objective:  ?*** ?General: No acute distress.  Patient appears well-groomed.   ?Head:  Normocephalic/atraumatic ?Eyes:  fundi examined but not visualized ?Neck: supple, no paraspinal tenderness, full range of motion ?Back: No paraspinal tenderness ?Heart: regular rate and rhythm ?Lungs: Clear to auscultation bilaterally. ?Vascular: No carotid bruits. ?Neurological Exam: ?Mental status: alert and oriented to person, place, and time, recent and remote memory intact, fund of knowledge intact, attention and concentration intact, speech fluent and not dysarthric, language intact. ?Cranial nerves: ?CN I: not tested ?CN II: pupils equal, round and reactive to light, visual fields intact ?CN III, IV, VI:  full range of motion, no nystagmus, no ptosis ?CN V: facial sensation intact. ?CN VII: upper and lower face symmetric ?CN VIII: hearing intact ?CN IX, X: gag intact, uvula midline ?CN XI: sternocleidomastoid and trapezius muscles intact ?CN XII: tongue midline ?Bulk & Tone: normal, no fasciculations. ?Motor:  muscle strength 5/5 throughout ?Sensation:  Pinprick, temperature and vibratory sensation intact. ?Deep Tendon  Reflexes:  2+ throughout,  toes downgoing.   ?Finger to nose testing:  Without dysmetria.   ?Heel to shin:  Without dysmetria.   ?Gait:  Normal station and stride.  Romberg negative. ? ? ? ?Thank you for allowing me to take part in the care of this patient. ? ?Metta Clines, DO ? ?CC:  Olivia Clelland, PA-C ? Benjiman Core, PA-C ? ? ? ? ?

## 2021-06-15 DIAGNOSIS — G4733 Obstructive sleep apnea (adult) (pediatric): Secondary | ICD-10-CM | POA: Diagnosis not present

## 2021-06-22 ENCOUNTER — Other Ambulatory Visit: Payer: Self-pay | Admitting: Nurse Practitioner

## 2021-06-22 ENCOUNTER — Other Ambulatory Visit (HOSPITAL_COMMUNITY)
Admission: RE | Admit: 2021-06-22 | Discharge: 2021-06-22 | Disposition: A | Discharge status: Home / Self Care | Payer: PPO | Source: Ambulatory Visit | Attending: Nurse Practitioner | Admitting: Nurse Practitioner

## 2021-06-22 DIAGNOSIS — Z01419 Encounter for gynecological examination (general) (routine) without abnormal findings: Secondary | ICD-10-CM | POA: Insufficient documentation

## 2021-06-22 DIAGNOSIS — E282 Polycystic ovarian syndrome: Secondary | ICD-10-CM | POA: Diagnosis not present

## 2021-06-22 DIAGNOSIS — N926 Irregular menstruation, unspecified: Secondary | ICD-10-CM | POA: Diagnosis not present

## 2021-06-22 DIAGNOSIS — N898 Other specified noninflammatory disorders of vagina: Secondary | ICD-10-CM | POA: Diagnosis not present

## 2021-06-22 DIAGNOSIS — N644 Mastodynia: Secondary | ICD-10-CM | POA: Diagnosis not present

## 2021-06-22 DIAGNOSIS — Z124 Encounter for screening for malignant neoplasm of cervix: Secondary | ICD-10-CM | POA: Insufficient documentation

## 2021-06-22 DIAGNOSIS — Z1151 Encounter for screening for human papillomavirus (HPV): Secondary | ICD-10-CM | POA: Diagnosis not present

## 2021-06-23 DIAGNOSIS — F900 Attention-deficit hyperactivity disorder, predominantly inattentive type: Secondary | ICD-10-CM | POA: Diagnosis not present

## 2021-06-23 DIAGNOSIS — F3112 Bipolar disorder, current episode manic without psychotic features, moderate: Secondary | ICD-10-CM | POA: Diagnosis not present

## 2021-06-23 DIAGNOSIS — G4730 Sleep apnea, unspecified: Secondary | ICD-10-CM | POA: Diagnosis not present

## 2021-06-24 LAB — CYTOLOGY - PAP
Adequacy: ABSENT
Comment: NEGATIVE
Diagnosis: NEGATIVE
High risk HPV: NEGATIVE

## 2021-07-29 DIAGNOSIS — F319 Bipolar disorder, unspecified: Secondary | ICD-10-CM | POA: Diagnosis not present

## 2021-07-29 DIAGNOSIS — F314 Bipolar disorder, current episode depressed, severe, without psychotic features: Secondary | ICD-10-CM | POA: Diagnosis not present

## 2021-07-29 DIAGNOSIS — G4733 Obstructive sleep apnea (adult) (pediatric): Secondary | ICD-10-CM | POA: Diagnosis not present

## 2021-07-29 DIAGNOSIS — D179 Benign lipomatous neoplasm, unspecified: Secondary | ICD-10-CM | POA: Diagnosis not present

## 2021-07-29 DIAGNOSIS — Z6841 Body Mass Index (BMI) 40.0 and over, adult: Secondary | ICD-10-CM | POA: Diagnosis not present

## 2021-07-29 DIAGNOSIS — G894 Chronic pain syndrome: Secondary | ICD-10-CM | POA: Diagnosis not present

## 2021-07-29 DIAGNOSIS — F900 Attention-deficit hyperactivity disorder, predominantly inattentive type: Secondary | ICD-10-CM | POA: Diagnosis not present

## 2021-07-29 DIAGNOSIS — G4730 Sleep apnea, unspecified: Secondary | ICD-10-CM | POA: Diagnosis not present

## 2021-08-06 DIAGNOSIS — F341 Dysthymic disorder: Secondary | ICD-10-CM | POA: Diagnosis not present

## 2021-08-06 DIAGNOSIS — R400 Somnolence: Secondary | ICD-10-CM | POA: Diagnosis not present

## 2021-08-06 DIAGNOSIS — G4733 Obstructive sleep apnea (adult) (pediatric): Secondary | ICD-10-CM | POA: Diagnosis not present

## 2021-08-10 DIAGNOSIS — R400 Somnolence: Secondary | ICD-10-CM | POA: Diagnosis not present

## 2021-08-10 DIAGNOSIS — F341 Dysthymic disorder: Secondary | ICD-10-CM | POA: Diagnosis not present

## 2021-08-10 DIAGNOSIS — G4733 Obstructive sleep apnea (adult) (pediatric): Secondary | ICD-10-CM | POA: Diagnosis not present

## 2021-08-17 ENCOUNTER — Other Ambulatory Visit: Payer: Self-pay | Admitting: Nurse Practitioner

## 2021-08-17 DIAGNOSIS — N898 Other specified noninflammatory disorders of vagina: Secondary | ICD-10-CM | POA: Diagnosis not present

## 2021-08-17 DIAGNOSIS — N946 Dysmenorrhea, unspecified: Secondary | ICD-10-CM

## 2021-08-17 DIAGNOSIS — E282 Polycystic ovarian syndrome: Secondary | ICD-10-CM | POA: Diagnosis not present

## 2021-08-17 DIAGNOSIS — R39198 Other difficulties with micturition: Secondary | ICD-10-CM | POA: Diagnosis not present

## 2021-08-18 ENCOUNTER — Ambulatory Visit
Admission: RE | Admit: 2021-08-18 | Discharge: 2021-08-18 | Disposition: A | Discharge status: Home / Self Care | Payer: HMO | Source: Ambulatory Visit | Attending: Nurse Practitioner | Admitting: Nurse Practitioner

## 2021-08-18 DIAGNOSIS — N946 Dysmenorrhea, unspecified: Secondary | ICD-10-CM | POA: Diagnosis not present

## 2021-08-18 DIAGNOSIS — F900 Attention-deficit hyperactivity disorder, predominantly inattentive type: Secondary | ICD-10-CM | POA: Diagnosis not present

## 2021-08-18 DIAGNOSIS — F314 Bipolar disorder, current episode depressed, severe, without psychotic features: Secondary | ICD-10-CM | POA: Diagnosis not present

## 2021-08-18 DIAGNOSIS — N854 Malposition of uterus: Secondary | ICD-10-CM | POA: Diagnosis not present

## 2021-08-18 DIAGNOSIS — G4730 Sleep apnea, unspecified: Secondary | ICD-10-CM | POA: Diagnosis not present

## 2021-08-19 DIAGNOSIS — Z79899 Other long term (current) drug therapy: Secondary | ICD-10-CM | POA: Diagnosis not present

## 2021-08-19 DIAGNOSIS — G894 Chronic pain syndrome: Secondary | ICD-10-CM | POA: Diagnosis not present

## 2021-08-19 DIAGNOSIS — G43109 Migraine with aura, not intractable, without status migrainosus: Secondary | ICD-10-CM | POA: Diagnosis not present

## 2021-08-19 DIAGNOSIS — M797 Fibromyalgia: Secondary | ICD-10-CM | POA: Diagnosis not present

## 2021-08-19 DIAGNOSIS — K5903 Drug induced constipation: Secondary | ICD-10-CM | POA: Diagnosis not present

## 2021-08-19 DIAGNOSIS — E282 Polycystic ovarian syndrome: Secondary | ICD-10-CM | POA: Diagnosis not present

## 2021-08-19 DIAGNOSIS — Z79891 Long term (current) use of opiate analgesic: Secondary | ICD-10-CM | POA: Diagnosis not present

## 2021-08-26 DIAGNOSIS — G4730 Sleep apnea, unspecified: Secondary | ICD-10-CM | POA: Diagnosis not present

## 2021-08-26 DIAGNOSIS — F3131 Bipolar disorder, current episode depressed, mild: Secondary | ICD-10-CM | POA: Diagnosis not present

## 2021-08-26 DIAGNOSIS — F900 Attention-deficit hyperactivity disorder, predominantly inattentive type: Secondary | ICD-10-CM | POA: Diagnosis not present

## 2021-08-26 DIAGNOSIS — B0223 Postherpetic polyneuropathy: Secondary | ICD-10-CM | POA: Diagnosis not present

## 2021-09-05 DIAGNOSIS — G4733 Obstructive sleep apnea (adult) (pediatric): Secondary | ICD-10-CM | POA: Diagnosis not present

## 2021-09-05 DIAGNOSIS — F341 Dysthymic disorder: Secondary | ICD-10-CM | POA: Diagnosis not present

## 2021-09-05 DIAGNOSIS — R400 Somnolence: Secondary | ICD-10-CM | POA: Diagnosis not present

## 2021-10-06 DIAGNOSIS — F341 Dysthymic disorder: Secondary | ICD-10-CM | POA: Diagnosis not present

## 2021-10-06 DIAGNOSIS — R400 Somnolence: Secondary | ICD-10-CM | POA: Diagnosis not present

## 2021-10-06 DIAGNOSIS — G4733 Obstructive sleep apnea (adult) (pediatric): Secondary | ICD-10-CM | POA: Diagnosis not present

## 2021-10-11 ENCOUNTER — Emergency Department (HOSPITAL_COMMUNITY)
Admission: EM | Admit: 2021-10-11 | Discharge: 2021-10-11 | Disposition: A | Discharge status: Home / Self Care | Payer: PPO | Attending: Emergency Medicine | Admitting: Emergency Medicine

## 2021-10-11 ENCOUNTER — Emergency Department (HOSPITAL_COMMUNITY): Payer: PPO

## 2021-10-11 ENCOUNTER — Other Ambulatory Visit: Payer: Self-pay

## 2021-10-11 ENCOUNTER — Encounter (HOSPITAL_COMMUNITY): Payer: Self-pay

## 2021-10-11 DIAGNOSIS — Z91040 Latex allergy status: Secondary | ICD-10-CM | POA: Diagnosis not present

## 2021-10-11 DIAGNOSIS — R002 Palpitations: Secondary | ICD-10-CM | POA: Insufficient documentation

## 2021-10-11 DIAGNOSIS — Z7984 Long term (current) use of oral hypoglycemic drugs: Secondary | ICD-10-CM | POA: Diagnosis not present

## 2021-10-11 DIAGNOSIS — R079 Chest pain, unspecified: Secondary | ICD-10-CM | POA: Insufficient documentation

## 2021-10-11 DIAGNOSIS — R000 Tachycardia, unspecified: Secondary | ICD-10-CM | POA: Insufficient documentation

## 2021-10-11 LAB — CBC WITH DIFFERENTIAL/PLATELET
Abs Immature Granulocytes: 0.05 10*3/uL (ref 0.00–0.07)
Basophils Absolute: 0.1 10*3/uL (ref 0.0–0.1)
Basophils Relative: 1 %
Eosinophils Absolute: 0.2 10*3/uL (ref 0.0–0.5)
Eosinophils Relative: 2 %
HCT: 44.5 % (ref 36.0–46.0)
Hemoglobin: 14.5 g/dL (ref 12.0–15.0)
Immature Granulocytes: 0 %
Lymphocytes Relative: 27 %
Lymphs Abs: 4 10*3/uL (ref 0.7–4.0)
MCH: 25.9 pg — ABNORMAL LOW (ref 26.0–34.0)
MCHC: 32.6 g/dL (ref 30.0–36.0)
MCV: 79.5 fL — ABNORMAL LOW (ref 80.0–100.0)
Monocytes Absolute: 0.8 10*3/uL (ref 0.1–1.0)
Monocytes Relative: 5 %
Neutro Abs: 9.7 10*3/uL — ABNORMAL HIGH (ref 1.7–7.7)
Neutrophils Relative %: 65 %
Platelets: 319 10*3/uL (ref 150–400)
RBC: 5.6 MIL/uL — ABNORMAL HIGH (ref 3.87–5.11)
RDW: 13.8 % (ref 11.5–15.5)
WBC: 14.7 10*3/uL — ABNORMAL HIGH (ref 4.0–10.5)
nRBC: 0 % (ref 0.0–0.2)

## 2021-10-11 LAB — BASIC METABOLIC PANEL
Anion gap: 11 (ref 5–15)
BUN: 10 mg/dL (ref 6–20)
CO2: 20 mmol/L — ABNORMAL LOW (ref 22–32)
Calcium: 9.2 mg/dL (ref 8.9–10.3)
Chloride: 104 mmol/L (ref 98–111)
Creatinine, Ser: 0.92 mg/dL (ref 0.44–1.00)
GFR, Estimated: >60 mL/min (ref >60)
Glucose, Bld: 118 mg/dL — ABNORMAL HIGH (ref 70–99)
Potassium: 4.2 mmol/L (ref 3.5–5.1)
Sodium: 135 mmol/L (ref 135–145)

## 2021-10-11 LAB — MAGNESIUM: Magnesium: 1.9 mg/dL (ref 1.7–2.4)

## 2021-10-11 LAB — TROPONIN I (HIGH SENSITIVITY): Troponin I (High Sensitivity): 9 ng/L (ref <18)

## 2021-10-11 NOTE — ED Triage Notes (Signed)
Pt was lying down at home and felt her heart racing. Pt HR was 200bpm after  deep breathing HR down to 120. Pt also having chest pain with tachycardia. Denies SOB. Pain does not radiate. Pain was central and dull

## 2021-10-11 NOTE — ED Provider Triage Note (Addendum)
Emergency Medicine Provider Triage Evaluation Note  Krystal French , a 46 y.o. female  was evaluated in triage.  Pt complains of chest pain, tachycardia.  States began while sleeping, checked HR on home pulse ox and was 220.  States HR seems to be downtrending, no current pain.  No hx of same.  Denies excessive caffeine use, does take methylphenidate for ADD.  Review of Systems  Positive: Chest pain, tachycardia Negative: fever  Physical Exam  BP (!) 137/91   Pulse (!) 118   SpO2 98%   Gen:   Awake, no distress   Resp:  Normal effort  MSK:   Moves extremities without difficulty  Other:  HR 115 during exam  Medical Decision Making  Medically screening exam initiated at 2:25 AM.  Appropriate orders placed.  [name] was informed that the remainder of the evaluation will be completed by another provider, this initial triage assessment does not replace that evaluation, and the importance of remaining in the ED until their evaluation is complete.  Tachycardia-- episode at home with HR 220's on home pulse.  Downtrending in triage, now around 115.  Denies active chest pain.  Question possible episode of SVT? EKG, labs, CXR.   Larene Pickett, PA-C 10/11/21 0229    Larene Pickett, PA-C 10/11/21 [phone]

## 2021-10-11 NOTE — ED Provider Notes (Signed)
[facility] EMERGENCY DEPARTMENT Provider Note   CSN: [id number] Arrival date & time: 10/11/21  0144     History  Chief Complaint  Patient presents with   Chest Pain   Tachycardia    Krystal French is a 46 y.o. female.  46 yo F here with palpitations. HR as high at 240 for about 10 minutes - - came back to normal. Had cp that radiates to left arm that is continuing to improve. No other associated symptoms. No hy/o Afib, cardiac issues or SVT. No new meds. No change in meds. No dietary changes.    Chest Pain      Home Medications Prior to Admission medications   Medication Sig Start Date End Date Taking? Authorizing Provider  Armodafinil 200 MG TABS Take 1 tablet by mouth daily. Patient states 250 MG daily 04/22/19   [provider]  Buprenorphine HCl (BELBUCA) 600 MCG FILM in the morning and at bedtime.    [provider]  clonazePAM (KLONOPIN) 1 MG tablet 3 (three) times daily as needed. 06/03/19   [provider]  divalproex (DEPAKOTE) 500 MG DR tablet Take 500 mg by mouth 2 (two) times daily. Take 1 tab (500 mg) in the am and Take 3 tabs (1000 mg) at bedtime    [provider]  doxepin (SINEQUAN) 75 MG capsule Take 75-150 mg by mouth at bedtime as needed (sleep).    [provider]  DULoxetine (CYMBALTA) 60 MG capsule duloxetine 60 mg capsule,delayed release    [provider]  fish oil-omega-3 fatty acids 1000 MG capsule Take 1 g by mouth daily.    [provider]  fluticasone (FLONASE) 50 MCG/ACT nasal spray fluticasone propionate 50 mcg/actuation nasal spray,suspension    [provider]  folic acid (FOLVITE) 1 MG tablet Take 1 mg by mouth daily.    [provider]  HYDROmorphone (DILAUDID) 4 MG tablet Take 4 mg by mouth at bedtime. 06/03/19   [provider]  lamoTRIgine (LAMICTAL) 150 MG tablet Take 150 mg by mouth at bedtime.    [provider]  LARIN FE  1.5/30 1.5-30 MG-MCG tablet Take 1 tablet by mouth daily. Patient not taking: Reported on 11/21/2019 06/03/19   [provider]  metFORMIN (GLUCOPHAGE) 500 MG tablet Take 500 mg by mouth daily. 06/03/19   [provider]  NARCAN 4 MG/0.1ML LIQD nasal spray kit SMARTSIG:1 Spray(s) Both Nares Once PRN 01/28/19   [provider]  ondansetron (ZOFRAN) 4 MG tablet Take 4 mg by mouth every 8 (eight) hours as needed for nausea.    [provider]  Ubrogepant (UBRELVY) 100 MG TABS Ubrelvy 100 mg tablet    [provider]  venlafaxine XR (EFFEXOR-XR) 150 MG 24 hr capsule Take 300 mg by mouth at bedtime.    [provider]  Vitamin D, Ergocalciferol, (DRISDOL) 1.25 MG (50000 UNIT) CAPS capsule Take 50,000 Units by mouth every 7 (seven) days.    [provider]  zaleplon (SONATA) 10 MG capsule Take 10 mg by mouth at bedtime as needed. 06/20/19   [provider]  zonisamide (ZONEGRAN) 100 MG capsule Take 200 mg by mouth at bedtime.  06/26/19   [provider]      Allergies    Codeine, Morphine and related, Latex, and Sulfa antibiotics    Review of Systems   Review of Systems  Cardiovascular:  Positive for chest pain.    Physical Exam Updated Vital Signs BP  129/85   Pulse 82   Resp 18   Ht _0  (1.727 m)   Wt (!) 186.8 kg   SpO2 99%   BMI 62.62 kg/m  Physical Exam Vitals and nursing note reviewed.  Constitutional:      Appearance: She is well-developed.  HENT:     Head: Normocephalic and atraumatic.  Cardiovascular:     Rate and Rhythm: Normal rate and regular rhythm.  Pulmonary:     Effort: No respiratory distress.     Breath sounds: No stridor.  Chest:     Chest wall: No mass.  Abdominal:     General: There is no distension.     Palpations: Abdomen is soft.  Musculoskeletal:        General: Normal range of motion.     Cervical back: Normal range of motion.  Skin:    General: Skin is warm and dry.   Neurological:     Mental Status: She is alert.    ED Results / Procedures / Treatments   Labs (all labs ordered are listed, but only abnormal results are displayed) Labs Reviewed  CBC WITH DIFFERENTIAL/PLATELET - Abnormal; Notable for the following components:      Result Value   WBC 14.7 (*)    RBC 5.60 (*)    MCV 79.5 (*)    MCH 25.9 (*)    Neutro Abs 9.7 (*)    All other components within normal limits  BASIC METABOLIC PANEL - Abnormal; Notable for the following components:   CO2 20 (*)    Glucose, Bld 118 (*)    All other components within normal limits  MAGNESIUM  TROPONIN I (HIGH SENSITIVITY)    EKG EKG Interpretation  Date/Time:  Monday October 11 2021 02:26:00 EDT Ventricular Rate:  120 PR Interval:  136 QRS Duration: 82 QT Interval:  322 QTC Calculation: 455 R Axis:   15 Text Interpretation: Sinus tachycardia Cannot rule out Anterior infarct , age undetermined Abnormal ECG When compared with ECG of 24-Mar-2015 21:56, PREVIOUS ECG IS PRESENT Confirmed by Merrily Pew [phone]) on 10/11/2021 3:58:15 AM  Radiology DG Chest 2 View  Result Date: 10/11/2021 CLINICAL DATA:  Chest pain and tachycardia. EXAM: CHEST - 2 VIEW COMPARISON:  October 02, 2011 FINDINGS: The heart size and mediastinal contours are within normal limits. Both lungs are clear. The visualized skeletal structures are unremarkable. IMPRESSION: No active cardiopulmonary disease. Electronically Signed   By: Virgina Norfolk M.D.   On: 10/11/2021 02:59    Procedures Procedures    Medications Ordered in ED Medications - No data to display  ED Course/ Medical Decision Making/ A&P                           Medical Decision Making  Likely SVT resolved. Labs/ecg/cxr here reassuring. Lower suspicion for A-fib or V. tach secondary to her symptoms during it and her lack of significant medical history.  Did not feel any beta-blockers or other medications were indicated as first-line SVT can follow-up as  needed with cardiology.  Final Clinical Impression(s) / ED Diagnoses Final diagnoses:  Palpitations    Rx / DC Orders ED Discharge Orders     None         Anadalay Macdonell, Corene Cornea, MD 10/11/21 [phone]

## 2021-10-27 NOTE — Progress Notes (Signed)
NEUROLOGY CONSULTATION NOTE  Krystal French MRN: [id number] DOB: September 30, 1975  Referring provider: Thayer Ohm, PA Primary care provider: Thayer Ohm, PA  Reason for consult:  headaches  Assessment/Plan:   Chronic migraine without aura, without status migrainosus, not intractable Migraine with aura, without status migrainosus, not intractable  Patient has chronic migraine - at least 15 headache days a month for over 3 consecutive months.  She has tried multiple preventative medications including propranolol, venlafaxine, duloxetine, topiramate, divaloproex, zonisamide, Aimovig.  She is unable to afford Ajovy.  She meets criteria for Botox, which I believe is the most appropriate medication for her.  We will check to see if it is now formulary.   Subjective:  Krystal French is a 46 year old female with Bipolar 2 disorder, fibromyalgia, sleep apnea and anxiety who presents for headaches.  History supplemented by referring provider's note.  Onset:  46 years old Location:  usually right frontal region, occasionally band across back of head Quality:  sharp, steady Intensity:  severe.   Aura:  phantosmia (sometimes smells roses or excrement) Prodrome:  photosensitivity, phono-sensitivity  Associated symptoms:  nausea, vomiting, photophobia, phonophobia, osmophobia, eyes water.  She denies associated visual disturbance, unilateral numbness or weakness. Duration:  6 hours to all day (couple of hours with Krystal French but can only afford 2-4 tablets a month) Frequency:  15-20 days a month Often wakes up in middle of night with them.   Triggers:  soy, menses (has a migraine first day of period), change in barometric pressure/weather Relieving factors:  rest in dark and quiet room Activity:  cannot function about 3 days a week  She has previously had multiple brain MRIs.  CT head from 03/24/2015 personally reviewed was normal.  Past NSAIDS/analgesics:  ibuprofen, acetaminophen, naproxen,  Fioricet, Excedrin Past abortive triptans:  sumatriptan tab/[place], rizatriptan - cannot take triptans because they cause palpitations Past abortive ergotamine:  yes but cannot remember name Past muscle relaxants:  none Past anti-emetic:  promethazine (drowsy), Reglan, Compazine Past antihypertensive medications:  propranolol Past antidepressant medications:  venlafaxine, duloxetine, sertraline Past anticonvulsant medications:  divalproex, topiramate, lamotrigine Past anti-CGRP:  Krystal French, Aimovig, Ajovy (effective but cannot afford) Past vitamins/Herbal/Supplements:  feverfew, magnesium Past antihistamines/decongestants:  none Other past therapies:  none  Current NSAIDS/analgesics:  hydromorphone (fibromyalgia but will treat acute migraines) Current triptans:  CANNOT TAKE THEM DUE TO CAUSING PALPITATIONS Current ergotamine:  none Current anti-emetic:  Zofran 54m Current muscle relaxants:  cyclobenzaprine 130mCurrent Antihypertensive medications:  none Current Antidepressant medications:  none Current Anticonvulsant medications:  zonisamide 20052maily, lamotrigine 150m93mS Current anti-CGRP:  Ubrelvy 100mg13mrent Vitamins/Herbal/Supplements:  fish oil, D Current Antihistamines/Decongestants:  Flonase Other therapy:  cognitive behavioral therapy Hormone/birth control:  LARIN FE Other medications:  armodafinil, buprenorphine, clonazepam, zaleplon  Multiple financial constraints and cannot afford many medications such as Botox, Aimovig, Ajovy  Caffeine:  1 cup of coffee daily, 8-12 oz Dr. PeppeMalachi Bondsy Alcohol:  none Smoking: none Diet:  at least one liter water daily.  Sometimes skips meals Exercise:  tries to use the pool but not routine Depression:  yes; Anxiety:  yes.  Has Bipolar disorder Other pain:  fibromyalgia Sleep hygiene: broken - wakes up often during the night.   Family history of migraines:  maternal aunts      PAST MEDICAL HISTORY: Past Medical History:   Diagnosis Date   Anxiety    B[place] 2 [place]    M[place]raine    Morbid obesity (HCC)[place]  Neuropathy    Polycystic ovaries    Sleep apnea     PAST SURGICAL HISTORY: Past Surgical History:  Procedure Laterality Date   CHOLECYSTECTOMY     knee arthroscopic     WISDOM TOOTH EXTRACTION      MEDICATIONS: Current Outpatient Medications on File Prior to Visit  Medication Sig Dispense Refill   Armodafinil 200 MG TABS Take 1 tablet by mouth daily. Patient states 250 MG daily     Buprenorphine HCl (BELBUCA) 600 MCG FILM in the morning and at bedtime.     clonazePAM (KLONOPIN) 1 MG tablet 3 (three) times daily as needed.     divalproex (DEPAKOTE) 500 MG DR tablet Take 500 mg by mouth 2 (two) times daily. Take 1 tab (500 mg) in the am and Take 3 tabs (1000 mg) at bedtime     doxepin (SINEQUAN) 75 MG capsule Take 75-150 mg by mouth at bedtime as needed (sleep).     DULoxetine (CYMBALTA) 60 MG capsule duloxetine 60 mg capsule,delayed release     fish oil-omega-3 fatty acids 1000 MG capsule Take 1 g by mouth daily.     fluticasone (FLONASE) 50 MCG/ACT nasal spray fluticasone propionate 50 mcg/actuation nasal spray,suspension     folic acid (FOLVITE) 1 MG tablet Take 1 mg by mouth daily.     HYDROmorphone (DILAUDID) 4 MG tablet Take 4 mg by mouth at bedtime.     lamoTRIgine (LAMICTAL) 150 MG tablet Take 150 mg by mouth at bedtime.     LARIN FE 1.5/30 1.5-30 MG-MCG tablet Take 1 tablet by mouth daily. (Patient not taking: Reported on 11/21/2019)     metFORMIN (GLUCOPHAGE) 500 MG tablet Take 500 mg by mouth daily.     NARCAN 4 MG/0.1ML LIQD nasal spray kit SMARTSIG:1 Spray(s) Both Nares Once PRN     ondansetron (ZOFRAN) 4 MG tablet Take 4 mg by mouth every 8 (eight) hours as needed for nausea.     Ubrogepant (UBRELVY) 100 MG TABS Ubrelvy 100 mg tablet     venlafaxine XR (EFFEXOR-XR) 150 MG 24 hr capsule Take 300 mg by mouth at bedtime.     Vitamin D, Ergocalciferol, (DRISDOL)  1.25 MG (50000 UNIT) CAPS capsule Take 50,000 Units by mouth every 7 (seven) days.     zaleplon (SONATA) 10 MG capsule Take 10 mg by mouth at bedtime as needed.     zonisamide (ZONEGRAN) 100 MG capsule Take 200 mg by mouth at bedtime.      No current facility-administered medications on file prior to visit.    ALLERGIES: Allergies  Allergen Reactions   Codeine    Morphine And Related    Latex     Makes patient skin peal off   Sulfa Antibiotics     coma    FAMILY HISTORY: Family History  Problem Relation Age of Onset   Fibromyalgia Mother    Sleep apnea Father    High Cholesterol Father    Dementia Father    Hypertension Father    ADD / ADHD Father     Objective:  Blood pressure (!) 144/102, pulse (!) 110, height 5' 11"  (1.803 m), weight (!) 365 lb (165.6 kg), SpO2 97 %. General: No acute distress.  Patient appears well-groomed.   Head:  Normocephalic/atraumatic Eyes:  fundi examined but not visualized Neck: supple, no paraspinal tenderness, full range of motion Back: No paraspinal tenderness Heart: regular rate and rhythm Lungs: Clear to auscultation bilaterally. Vascular: No carotid bruits. Neurological Exam: Mental status:  alert and oriented to person, place, and time, speech fluent and not dysarthric, language intact. Cranial nerves: CN I: not tested CN II: pupils equal, round and reactive to light, visual fields intact CN III, IV, VI:  full range of motion, no nystagmus, no ptosis CN V: facial sensation intact. CN VII: upper and lower face symmetric CN VIII: hearing intact CN IX, X: gag intact, uvula midline CN XI: sternocleidomastoid and trapezius muscles intact CN XII: tongue midline Bulk & Tone: normal, no fasciculations. Motor:  muscle strength 5/5 throughout Sensation:  temperature sensation reduced in right foot and vibratory sensation reduced in both feet. Deep Tendon Reflexes:  2+ throughout,  toes downgoing.   Finger to nose testing:  Without  dysmetria.   Heel to shin:  Without dysmetria.   Gait:  Normal station and stride.  Romberg negative.    Thank you for allowing me to take part in the care of this patient.  Metta Clines, DO  CC: Thayer Ohm, PA

## 2021-10-29 ENCOUNTER — Telehealth: Payer: Self-pay

## 2021-10-29 ENCOUNTER — Encounter: Payer: Self-pay | Admitting: Neurology

## 2021-10-29 ENCOUNTER — Ambulatory Visit: Payer: PPO | Admitting: Neurology

## 2021-10-29 VITALS — BP 144/102 | HR 110 | Ht 71.0 in | Wt 365.0 lb

## 2021-10-29 DIAGNOSIS — G43109 Migraine with aura, not intractable, without status migrainosus: Secondary | ICD-10-CM

## 2021-10-29 DIAGNOSIS — G43709 Chronic migraine without aura, not intractable, without status migrainosus: Secondary | ICD-10-CM

## 2021-10-29 NOTE — Telephone Encounter (Signed)
Patient seen in office, Per DR.Jaffe patient to start Botox.  Pa team please start a PA for Botox 200 units IM

## 2021-10-29 NOTE — Patient Instructions (Addendum)
  We will try to get Botox approved.  In the meantime, you may consider asking your psychiatrist about increasing zonisamide to '300mg'$  daily (unless she feels it is not appropriate for you). Limit use of pain relievers to no more than 2 days out of the week.  These medications include acetaminophen, NSAIDs (ibuprofen/Advil/Motrin, naproxen/Aleve, triptans (Imitrex/sumatriptan), Excedrin, and narcotics.  This will help reduce risk of rebound headaches. Be aware of common food triggers:  - Caffeine:  coffee, black tea, cola, Mt. Dew  - Chocolate  - Dairy:  aged cheeses (brie, blue, cheddar, gouda, [place], provolone, [place], Swiss, etc), chocolate milk, buttermilk, sour cream, limit eggs and yogurt  - Nuts, peanut butter  - Alcohol  - Cereals/grains:  FRESH breads (fresh bagels, sourdough, doughnuts), yeast productions  - Processed/canned/aged/cured meats (pre-packaged deli meats, hotdogs)  - MSG/glutamate:  soy sauce, flavor enhancer, pickled/preserved/marinated foods  - Sweeteners:  aspartame (Equal, Nutrasweet).  Sugar and Splenda are okay  - Vegetables:  legumes (lima beans, lentils, snow peas, fava beans, pinto peans, peas, garbanzo beans), sauerkraut, onions, olives, pickles  - Fruit:  avocados, bananas, citrus fruit (orange, lemon, grapefruit), mango  - Other:  Frozen meals, macaroni and cheese Routine exercise Stay adequately hydrated (aim for 64 oz water daily) Keep headache diary Maintain proper stress management Maintain proper sleep hygiene Do not skip meals Consider supplements:  magnesium citrate '400mg'$  daily, riboflavin '400mg'$  daily, coenzyme Q10 '100mg'$  three times daily.

## 2021-11-02 ENCOUNTER — Telehealth (HOSPITAL_COMMUNITY): Payer: Self-pay | Admitting: Pharmacy Technician

## 2021-11-02 ENCOUNTER — Other Ambulatory Visit (HOSPITAL_COMMUNITY): Payer: Self-pay

## 2021-11-02 NOTE — Telephone Encounter (Signed)
Patient Advocate Encounter   Received notification that prior authorization for Botox 200UNIT solution is required.   PA submitted on 11/02/2021 Key [id number] Status is pending       Lyndel Safe, [place] Patient Advocate Specialist [place] Patient Advocate Team Direct Number: [phone]  Fax: [phone]

## 2021-11-03 DIAGNOSIS — F3112 Bipolar disorder, current episode manic without psychotic features, moderate: Secondary | ICD-10-CM | POA: Diagnosis not present

## 2021-11-03 DIAGNOSIS — R002 Palpitations: Secondary | ICD-10-CM | POA: Insufficient documentation

## 2021-11-03 DIAGNOSIS — G4730 Sleep apnea, unspecified: Secondary | ICD-10-CM | POA: Diagnosis not present

## 2021-11-03 DIAGNOSIS — F900 Attention-deficit hyperactivity disorder, predominantly inattentive type: Secondary | ICD-10-CM | POA: Diagnosis not present

## 2021-11-03 NOTE — Progress Notes (Signed)
Cardiology Office Note   Date:  11/05/2021   ID:  Krystal French, DOB 24-Apr-1975, MRN [id number]  PCP:  Johna Roles, PA  Cardiologist:   None Referring:    Chief Complaint  Patient presents with   Chest Pain   Palpitations   Tachycardia      History of Present Illness: Krystal French is a 46 y.o. female who presents for evaluation of palpitations.  She was in the ED in August.  I reviewed these records for this visit.   There was no recorded arrhythmia although SVT was suspected in the ED note.    She said that she was getting ready to go to sleep her heart started pounding.  She has a pulse ox is that she was able to check and find out that her rate was in the 240s.  However, by the time she got the emergency room they did not capture this.  She said it lasted about 10 minutes.  This was on 8/14.  He came back last week again for about 10 minutes.  However, she was able to do a vagal maneuver which she been talking she said it did not last as long.  She does get some chest heaviness with that although her troponins were negative in the ER.  There were no acute ST segment changes on her EKG.  She has not find any triggers.  She has not had this before.  She gets lightheaded which she is not having any presyncope or syncope.  She otherwise feels okay.  She has sleep apnea and uses CPAP.  She does not have any chronic dyspnea, PND or orthopnea.  She has not had any fevers or chills.  I do note that she had normal electrolytes in the ER and last year in the fall with normal TSH.   Past Medical History:  Diagnosis Date   Anxiety    Bipolar 2 disorder ([place])    Fibromyalgia    Migraine    Morbid obesity ([place])    Neuropathy    Polycystic ovaries    Sleep apnea    CPAP    Past Surgical History:  Procedure Laterality Date   CHOLECYSTECTOMY     knee arthroscopic     WISDOM TOOTH EXTRACTION       Current Outpatient Medications  Medication Sig Dispense Refill    ARIPiprazole (ABILIFY) 10 MG tablet Take 10 mg by mouth daily at 2 PM.     Armodafinil 200 MG TABS Take 1 tablet by mouth daily. Patient states 250 MG daily     Buprenorphine HCl (BELBUCA) 600 MCG FILM in the morning and at bedtime.     clonazePAM (KLONOPIN) 1 MG tablet 3 (three) times daily as needed.     cyclobenzaprine (FLEXERIL) 10 MG tablet      fish oil-omega-3 fatty acids 1000 MG capsule Take 1 g by mouth daily.     fluticasone (FLONASE) 50 MCG/ACT nasal spray fluticasone propionate 50 mcg/actuation nasal spray,suspension     HYDROmorphone (DILAUDID) 4 MG tablet Take 4 mg by mouth at bedtime.     LARIN FE 1.5/30 1.5-30 MG-MCG tablet Take 1 tablet by mouth daily.     metFORMIN (GLUCOPHAGE) 500 MG tablet Take 500 mg by mouth daily.     Methylphenidate HCl ER, OSM, 72 MG TBCR      NARCAN 4 MG/0.1ML LIQD nasal spray kit SMARTSIG:1 Spray(s) Both Nares Once PRN     ondansetron (ZOFRAN) 4 MG  tablet Take 4 mg by mouth every 8 (eight) hours as needed for nausea.     prazosin (MINIPRESS) 2 MG capsule      zaleplon (SONATA) 10 MG capsule Take 10 mg by mouth at bedtime as needed.     zonisamide (ZONEGRAN) 100 MG capsule Take 200 mg by mouth at bedtime.      BELBUCA 750 MCG FILM Take 750 mcg by mouth daily.     folic acid (FOLVITE) 1 MG tablet Take 1 mg by mouth daily. (Patient not taking: Reported on 11/05/2021)     lamoTRIgine (LAMICTAL) 150 MG tablet Take 150 mg by mouth at bedtime. (Patient not taking: Reported on 11/05/2021)     Ubrogepant (UBRELVY) 100 MG TABS Ubrelvy 100 mg tablet (Patient not taking: Reported on 11/05/2021)     No current facility-administered medications for this visit.    Allergies:   Codeine, Morphine and related, Latex, and Sulfa antibiotics    Social History:  The patient  reports that she quit smoking about 27 years ago. Her smoking use included cigarettes. She has a 1.00 pack-year smoking history. She has never used smokeless tobacco. She reports that she does not drink  alcohol and does not use drugs.   Family History:  The patient's family history includes ADD / ADHD in her father; CAD (age of onset: 4) in her father; Dementia in her father and paternal grandfather; Fibromyalgia in her mother; High Cholesterol in her father; Hypertension in her father; Parkinsonism in her father; Sleep apnea in her father.    ROS:  Please see the history of present illness.   Otherwise, review of systems are positive for none.   All other systems are reviewed and negative.    PHYSICAL EXAM: VS:  BP 116/80 (BP Location: Left Arm, Patient Position: Sitting, Cuff Size: Large)   Pulse (!) 103   Ht 5' 11"  (1.803 m)   Wt (!) 363 lb (164.7 kg)   BMI 50.63 kg/m  , BMI Body mass index is 50.63 kg/m. GENERAL:  Well appearing HEENT:  Pupils equal round and reactive, fundi not visualized, oral mucosa unremarkable NECK:  No jugular venous distention, waveform within normal limits, carotid upstroke brisk and symmetric, no bruits, no thyromegaly LYMPHATICS:  No cervical, inguinal adenopathy LUNGS:  Clear to auscultation bilaterally BACK:  No CVA tenderness CHEST:  Unremarkable HEART:  PMI not displaced or sustained,S1 and S2 within normal limits, no S3, no S4, no clicks, no rubs, no murmurs ABD:  Flat, positive bowel sounds normal in frequency in pitch, no bruits, no rebound, no guarding, no midline pulsatile mass, no hepatomegaly, no splenomegaly EXT:  2 plus pulses throughout, no edema, no cyanosis no clubbing SKIN:  No rashes no nodules NEURO:  Cranial nerves II through XII grossly intact, motor grossly intact throughout PSYCH:  Cognitively intact, oriented to person place and time    EKG:  EKG is ordered today. The ekg ordered today demonstrates sinus rhythm, rate 103, axis within normal limits, intervals within normal limits, no acute ST-T wave changes.   Recent Labs: 10/11/2021: BUN 10; Creatinine, Ser 0.92; Hemoglobin 14.5; Magnesium 1.9; Platelets 319; Potassium 4.2;  Sodium 135    Lipid Panel No results found for: "CHOL", "TRIG", "HDL", "CHOLHDL", "VLDL", "LDLCALC", "LDLDIRECT"    Wt Readings from Last 3 Encounters:  11/05/21 (!) 363 lb (164.7 kg)  10/29/21 (!) 365 lb (165.6 kg)  10/11/21 (!) 411 lb 13.1 oz (186.8 kg)      Other studies Reviewed:  Additional studies/ records that were reviewed today include: ED records. Review of the above records demonstrates:  Please see elsewhere in the note.     ASSESSMENT AND PLAN:  Palpitations: It sounds like she may be having an SVT.  I would have her wear a 4-week monitor.  Have also talked to her about a wearable.  For now we will hold off on beta-blockers as she is only had 2 episodes.  She understands vagal maneuvers.  We talked briefly about the potential for ablation in the future.  She is up-to-date with blood work including her electrolytes and TSH.  I do not suspect an abnormal heartbeat so will not be checking an echocardiogram at this point.  We will have her come back after the monitor.   Current medicines are reviewed at length with the patient today.  The patient does not have concerns regarding medicines.  The following changes have been made:  no change  Labs/ tests ordered today include:   Orders Placed This Encounter  Procedures   CARDIAC EVENT MONITOR   EKG 12-Lead     Disposition:   FU with APP after the monitor.    Signed, Minus Breeding, MD  11/05/2021 8:44 AM    [facility]

## 2021-11-04 DIAGNOSIS — G894 Chronic pain syndrome: Secondary | ICD-10-CM | POA: Diagnosis not present

## 2021-11-04 DIAGNOSIS — E282 Polycystic ovarian syndrome: Secondary | ICD-10-CM | POA: Diagnosis not present

## 2021-11-04 DIAGNOSIS — G43109 Migraine with aura, not intractable, without status migrainosus: Secondary | ICD-10-CM | POA: Diagnosis not present

## 2021-11-04 DIAGNOSIS — K5903 Drug induced constipation: Secondary | ICD-10-CM | POA: Diagnosis not present

## 2021-11-04 DIAGNOSIS — Z79899 Other long term (current) drug therapy: Secondary | ICD-10-CM | POA: Diagnosis not present

## 2021-11-04 DIAGNOSIS — Z79891 Long term (current) use of opiate analgesic: Secondary | ICD-10-CM | POA: Diagnosis not present

## 2021-11-04 DIAGNOSIS — M797 Fibromyalgia: Secondary | ICD-10-CM | POA: Diagnosis not present

## 2021-11-05 ENCOUNTER — Encounter: Payer: Self-pay | Admitting: Cardiology

## 2021-11-05 ENCOUNTER — Ambulatory Visit: Payer: PPO | Attending: Cardiology | Admitting: Cardiology

## 2021-11-05 ENCOUNTER — Ambulatory Visit: Admit: 2021-11-05 | Payer: PPO | Admitting: General Surgery

## 2021-11-05 VITALS — BP 116/80 | HR 103 | Ht 71.0 in | Wt 363.0 lb

## 2021-11-05 DIAGNOSIS — R002 Palpitations: Secondary | ICD-10-CM | POA: Diagnosis not present

## 2021-11-05 SURGERY — EXCISION LIPOMA
Anesthesia: Monitor Anesthesia Care | Laterality: Bilateral

## 2021-11-05 NOTE — Patient Instructions (Signed)
Medication Instructions:  Your physician recommends that you continue on your current medications as directed. Please refer to the Current Medication list given to you today.  *If you need a refill on your cardiac medications before your next appointment, please call your pharmacy*  Testing/Procedures: [employer] Cardiac Event Monitor Instructions Your physician has requested you wear your cardiac event monitor for __30___ days. [employer] may call or text to confirm a shipping address. The monitor will be sent to a land address via UPS. [employer] will not ship a monitor to a PO BOX. It typically takes 3-5 days to receive your monitor after it has been enrolled. [employer] will assist with USPS tracking if your package is delayed. The telephone number for [employer] is [phone]. Once you have received your monitor, please review the enclosed instructions. Instruction tutorials can also be viewed under help and settings on the enclosed cell phone. Your monitor has already been registered assigning a specific monitor serial # to you.  Applying the monitor Remove cell phone from case and turn it on. The cell phone works as Dealer and needs to be within [employer] of you at all times. The cell phone will need to be charged on a daily basis. We recommend you plug the cell phone into the enclosed charger at your bedside table every night.  Monitor batteries: You will receive two monitor batteries labelled #1 and #2. These are your recorders. Plug battery #2 onto the second connection on the enclosed charger. Keep one battery on the charger at all times. This will keep the monitor battery deactivated. It will also keep it fully charged for when you need to switch your monitor batteries. A small light will be blinking on the battery emblem when it is charging. The light on the battery emblem will remain on when the battery is fully charged.  Open package of a Monitor strip. Insert  battery #1 into black hood on strip and gently squeeze monitor battery onto connection as indicated in instruction booklet. Set aside while preparing skin.  Choose location for your strip, vertical or horizontal, as indicated in the instruction booklet. Shave to remove all hair from location. There cannot be any lotions, oils, powders, or colognes on skin where monitor is to be applied. Wipe skin clean with enclosed Saline wipe. Dry skin completely.  Peel paper labeled #1 off the back of the Monitor strip exposing the adhesive. Place the monitor on the chest in the vertical or horizontal position shown in the instruction booklet. One arrow on the monitor strip must be pointing upward. Carefully remove paper labeled #2, attaching remainder of strip to your skin. Try not to create any folds or wrinkles in the strip as you apply it.  Firmly press and release the circle in the center of the monitor battery. You will hear a small beep. This is turning the monitor battery on. The heart emblem on the monitor battery will light up every 5 seconds if the monitor battery in turned on and connected to the patient securely. Do not push and hold the circle down as this turns the monitor battery off. The cell phone will locate the monitor battery. A screen will appear on the cell phone checking the connection of your monitor strip. This may read poor connection initially but change to good connection within the next minute. Once your monitor accepts the connection you will hear a series of 3 beeps followed by a climbing crescendo of beeps. A screen will appear on  the cell phone showing the two monitor strip placement options. Touch the picture that demonstrates where you applied the monitor strip.  Your monitor strip and battery are waterproof. You are able to shower, bathe, or swim with the monitor on. They just ask you do not submerge deeper than 3 feet underwater. We recommend removing the monitor if you  are swimming in a lake, river, or ocean.  Your monitor battery will need to be switched to a fully charged monitor battery approximately once a week. The cell phone will alert you of an action which needs to be made.  On the cell phone, tap for details to reveal connection status, monitor battery status, and cell phone battery status. The green dots indicates your monitor is in good status. A red dot indicates there is something that needs your attention.  To record a symptom, click the circle on the monitor battery. In 30-60 seconds a list of symptoms will appear on the cell phone. Select your symptom and tap save. Your monitor will record a sustained or significant arrhythmia regardless of you clicking the button. Some patients do not feel the heart rhythm irregularities. [employer] will notify us of any serious or critical events.  Refer to instruction booklet for instructions on switching batteries, changing strips, the Do not disturb or Pause features, or any additional questions.  Call [employer] at [phone], to confirm your monitor is transmitting and record your baseline. They will answer any questions you may have regarding the monitor instructions at that time.  Returning the monitor to [place] all equipment back into blue box. Peel off strip of paper to expose adhesive and close box securely. There is a prepaid UPS shipping label on this box. Drop in a UPS drop box, or at a UPS facility like [employer]. You may also contact [employer] to arrange UPS to pick up monitor package at your home.    Follow-Up: At [facility], you and your health needs are our priority.  As part of our continuing mission to provide you with exceptional heart care, we have created designated Provider Care Teams.  These Care Teams include your primary Cardiologist (physician) and Advanced Practice Providers (APPs -  Physician Assistants and Nurse Practitioners) who all work together  to provide you with the care you need, when you need it.  We recommend signing up for the patient portal called "MyChart".  Sign up information is provided on this After Visit Summary.  MyChart is used to connect with patients for Virtual Visits (Telemedicine).  Patients are able to view lab/test results, encounter notes, upcoming appointments, etc.  Non-urgent messages can be sent to your provider as well.   To learn more about what you can do with MyChart, go to [email or web address].    Your next appointment:   2 month(s) with NP or PA

## 2021-11-06 DIAGNOSIS — F341 Dysthymic disorder: Secondary | ICD-10-CM | POA: Diagnosis not present

## 2021-11-06 DIAGNOSIS — G4733 Obstructive sleep apnea (adult) (pediatric): Secondary | ICD-10-CM | POA: Diagnosis not present

## 2021-11-06 DIAGNOSIS — R400 Somnolence: Secondary | ICD-10-CM | POA: Diagnosis not present

## 2021-11-08 NOTE — Telephone Encounter (Signed)
PA request is unfortunately still pending at this time.

## 2021-11-11 DIAGNOSIS — G4733 Obstructive sleep apnea (adult) (pediatric): Secondary | ICD-10-CM | POA: Diagnosis not present

## 2021-11-11 DIAGNOSIS — R030 Elevated blood-pressure reading, without diagnosis of hypertension: Secondary | ICD-10-CM | POA: Diagnosis not present

## 2021-11-11 DIAGNOSIS — F319 Bipolar disorder, unspecified: Secondary | ICD-10-CM | POA: Diagnosis not present

## 2021-11-12 ENCOUNTER — Ambulatory Visit: Payer: PPO | Attending: Cardiology

## 2021-11-12 DIAGNOSIS — R002 Palpitations: Secondary | ICD-10-CM

## 2021-11-13 DIAGNOSIS — R002 Palpitations: Secondary | ICD-10-CM | POA: Diagnosis not present

## 2021-11-16 ENCOUNTER — Telehealth: Payer: Self-pay | Admitting: Nurse Practitioner

## 2021-11-16 ENCOUNTER — Telehealth: Payer: Self-pay | Admitting: Cardiology

## 2021-11-16 NOTE — Telephone Encounter (Signed)
Received call from [place] from [employer]) who reported that today at [employer] time, patient had run of SVT 192/min, then went into NSR at 76/min. Rhythm is in chart. Patient stated this happens while she sleeps and it wakes her up. This has occurred this past Sunday, Monday, and today. She will valsalva to convert. She reports lightheadedness and dizziness with PSVT and chest pain that lingers. Not sure if she has SOB with episodes. Does not have BP cuff, but will get one. Her chest is sore. Recommended heating pad; skin precautions discussed. Not on cardiac meds. Any advise for medications?

## 2021-11-16 NOTE — Telephone Encounter (Signed)
Krystal French - [employer] calling to give critical result

## 2021-11-16 NOTE — Telephone Encounter (Signed)
STAT if HR is under 50 or over 120 (normal HR is 60-100 beats per minute)  What is your heart rate? 109, 202, 190  Do you have a log of your heart rate readings (document readings)? yes  Do you have any other symptoms? Chest pain, lightheaded, dizzy

## 2021-11-16 NOTE — Telephone Encounter (Addendum)
Spoke with patient of Dr. Percival Spanish. She reports at least 1 episode of palpitations for the last 3 mornings - has woken her from sleep. She reports her pulse was 190-202 bpm, around 8:45am -- she uses a pulse ox. Current HR is 85bpm. She does not have a BP cuff. Chest pain is during episode of tachycardia/palps and persists after -- she said it hurts d/t heart beating so fast. The lightheadedness/dizziness and shortness of breath is associated with palps and resolves. She does bear down/vagal maneuver when tachycardic. She is calm and in no audible distress while on the phone   Will reach out to Markus Daft to see if anything can be pulled from 30 day monitor Will send message to MD for review/advice

## 2021-11-17 ENCOUNTER — Telehealth: Payer: Self-pay | Admitting: Cardiology

## 2021-11-17 ENCOUNTER — Other Ambulatory Visit: Payer: Self-pay | Admitting: *Deleted

## 2021-11-17 DIAGNOSIS — I471 Supraventricular tachycardia: Secondary | ICD-10-CM

## 2021-11-17 NOTE — Telephone Encounter (Signed)
   Cardiac Monitor Alert  Date of alert:  11/17/2021   Patient Name: Krystal French  DOB: 04-20-1975  MRN: [id number]   [place] Cardiologist: None  [facility] EP:  None    Monitor Information: Cardiac Event Monitor [[employer]]  Reason:  palpitations, SVT Ordering provider:  Hochrein MD   Alert Supraventricular Tachycardia - fastest HR:  192  at 8:00 am on 11/16/21 This is the 2nd alert for this rhythm.   Next Cardiology Appointment   EP referral pending   The patient was contacted yesterday by Clarnce Flock., RN and Cherylann Ratel, RN on 11/16/21 Arrhythmia, symptoms and history reviewed with Hochrein MD.   Plan:  EP referral      Fidel Levy, RN  11/17/2021 2:39 PM

## 2021-11-17 NOTE — Telephone Encounter (Signed)
Contacted EP scheduler and was able to get patient on for 11/18/21 with Lovena Le MD at 8;45am Patient aware of appointment date/time/location  MyChart message sent with appt info also

## 2021-11-17 NOTE — Telephone Encounter (Signed)
Minus Breeding, MD  Cristopher Estimable, RN Can we get her in as soon as possible to EP for SVT.  Let her know.  Thanks.    Urgent referral placed for EP. Left message for patient of referral. She is to call with questions.

## 2021-11-17 NOTE — Telephone Encounter (Signed)
Per secure chat with MD, OK to hold off on meto tart Rx as patient is going to see EP tomorrow

## 2021-11-18 ENCOUNTER — Ambulatory Visit: Payer: PPO | Attending: Internal Medicine | Admitting: Internal Medicine

## 2021-11-18 ENCOUNTER — Encounter: Payer: Self-pay | Admitting: Internal Medicine

## 2021-11-18 VITALS — BP 114/86 | HR 91 | Ht 71.0 in | Wt 258.8 lb

## 2021-11-18 DIAGNOSIS — I471 Supraventricular tachycardia: Secondary | ICD-10-CM | POA: Diagnosis not present

## 2021-11-18 DIAGNOSIS — R400 Somnolence: Secondary | ICD-10-CM | POA: Diagnosis not present

## 2021-11-18 DIAGNOSIS — G4733 Obstructive sleep apnea (adult) (pediatric): Secondary | ICD-10-CM | POA: Diagnosis not present

## 2021-11-18 DIAGNOSIS — R002 Palpitations: Secondary | ICD-10-CM

## 2021-11-18 DIAGNOSIS — F341 Dysthymic disorder: Secondary | ICD-10-CM | POA: Diagnosis not present

## 2021-11-18 MED ORDER — METOPROLOL SUCCINATE ER 25 MG PO TB24: 25.0000 mg | ORAL_TABLET | Freq: Every day | ORAL | 3 refills | Status: DC

## 2021-11-18 NOTE — Progress Notes (Signed)
HPI Krystal French is referred by Dr. [facility] for evaluation of SVT. She is a pleasant morbidly obese woman with a h/o palpitations who has been found on cardiac monitor to have sustained SVT. She gets sob and feels palpitations. No syncope. She tries to breath deeply and eventually the spells resolve. She has not been placed on any medications for SVT. She does not abuse ETOH or caffeine. Her weight is up and down. She has weighed as much as 400 lbs in the past. Allergies  Allergen Reactions   Codeine    Morphine And Related    Latex     Makes patient skin peal off   Sulfa Antibiotics     coma     Current Outpatient Medications  Medication Sig Dispense Refill   ARIPiprazole (ABILIFY) 10 MG tablet Take 10 mg by mouth daily at 2 PM.     Armodafinil 200 MG TABS Take 1 tablet by mouth daily. Patient states 250 MG daily     BELBUCA 750 MCG FILM Take 750 mcg by mouth 2 (two) times daily.     clonazePAM (KLONOPIN) 1 MG tablet 3 (three) times daily as needed.     cyclobenzaprine (FLEXERIL) 10 MG tablet      fish oil-omega-3 fatty acids 1000 MG capsule Take 1 g by mouth daily.     fluticasone (FLONASE) 50 MCG/ACT nasal spray fluticasone propionate 50 mcg/actuation nasal spray,suspension     folic acid (FOLVITE) 1 MG tablet Take 1 mg by mouth daily.     HYDROmorphone (DILAUDID) 4 MG tablet Take 4 mg by mouth at bedtime.     lamoTRIgine (LAMICTAL) 150 MG tablet Take 150 mg by mouth at bedtime.     LARIN FE 1.5/30 1.5-30 MG-MCG tablet Take 1 tablet by mouth daily.     metFORMIN (GLUCOPHAGE) 500 MG tablet Take 500 mg by mouth daily.     Methylphenidate HCl ER, OSM, 72 MG TBCR      NARCAN 4 MG/0.1ML LIQD nasal spray kit SMARTSIG:1 Spray(s) Both Nares Once PRN     ondansetron (ZOFRAN) 4 MG tablet Take 4 mg by mouth every 8 (eight) hours as needed for nausea.     prazosin (MINIPRESS) 2 MG capsule      Ubrogepant (UBRELVY) 100 MG TABS      zaleplon (SONATA) 10 MG capsule Take 10 mg by mouth at  bedtime as needed.     zonisamide (ZONEGRAN) 100 MG capsule Take 200 mg by mouth at bedtime.      No current facility-administered medications for this visit.     Past Medical History:  Diagnosis Date   Anxiety    Bipolar 2 disorder ([place])    Fibromyalgia    Migraine    Morbid obesity ([place])    Neuropathy    Polycystic ovaries    Sleep apnea    CPAP    ROS:   All systems reviewed and negative except as noted in the HPI.   Past Surgical History:  Procedure Laterality Date   CHOLECYSTECTOMY     knee arthroscopic     WISDOM TOOTH EXTRACTION       Family History  Problem Relation Age of Onset   Fibromyalgia Mother    Parkinsonism Father    Sleep apnea Father    High Cholesterol Father    Dementia Father    Hypertension Father    ADD / ADHD Father    CAD Father 2   Dementia Paternal  Grandfather      Social History   Socioeconomic History   Marital status: Married    Spouse name: Not on file   Number of children: 0   Years of education: Not on file   Highest education level: Not on file  Occupational History   Not on file  Tobacco Use   Smoking status: Former    Packs/day: 0.50    Years: 2.00    Total pack years: 1.00    Types: Cigarettes    Quit date: 85    Years since quitting: 27.7   Smokeless tobacco: Never  Vaping Use   Vaping Use: Never used  Substance and Sexual Activity   Alcohol use: No   Drug use: No    Comment: quit in 2006   Sexual activity: Not Currently  Other Topics Concern   Not on file  Social History Narrative   Lives at home with husband.      Social Determinants of Health   Financial Resource Strain: Not on file  Food Insecurity: Not on file  Transportation Needs: Not on file  Physical Activity: Not on file  Stress: Not on file  Social Connections: Not on file  Intimate Partner Violence: Not on file     BP 114/86   Pulse 91   Ht _0  (1.803 m)   Wt 258 lb 12.8 oz (117.4 kg)   LMP  (LMP Unknown)   SpO2 96%    BMI 36.10 kg/m  Wt - 358 Physical Exam:  Morbidly obese appearing NAD HEENT: Unremarkable Neck:  No JVD, no thyromegally Lymphatics:  No adenopathy Back:  No CVA tenderness Lungs:  Clear with no wheezes HEART:  Regular rate rhythm, no murmurs, no rubs, no clicks Abd:  soft, positive bowel sounds, no organomegally, no rebound, no guarding Ext:  2 plus pulses, no edema, no cyanosis, no clubbing Skin:  No rashes no nodules Neuro:  CN II through XII intact, motor grossly intact  EKG - NSR with no pre-excitation  Assess/Plan:  SVT - I have discussed the treatment options with the patient and recommended a trial of toprol. If this does not control her symptoms then catheter ablation is indicated though her weight might increase her risk.  Obesity - I have encouraged her to lose weight.   Krystal Overlie Kendrah Lovern,MD

## 2021-11-18 NOTE — Patient Instructions (Addendum)
Medication Instructions:  Your physician has recommended you make the following change in your medication:   Start Taking:  Metoprolol succinate 25 Mg-  ( Toprol XL )  Take one tablet ( 25 mg ) by mouth daily.    Lab Work: None ordered.  If you have labs (blood work) drawn today and your tests are completely normal, you will receive your results only by: [place] (if you have MyChart) OR A paper copy in the mail If you have any lab test that is abnormal or we need to change your treatment, we will call you to review the results.  Testing/Procedures: Dr. Cristopher Peru orders:  Discontinue use of your Heart Monitor 11/18/2021.  Follow-Up: At [facility], you and your health needs are our priority.  As part of our continuing mission to provide you with exceptional heart care, we have created designated Provider Care Teams.  These Care Teams include your primary Cardiologist (physician) and Advanced Practice Providers (APPs -  Physician Assistants and Nurse Practitioners) who all work together to provide you with the care you need, when you need it.  We recommend signing up for the patient portal called "MyChart".  Sign up information is provided on this After Visit Summary.  MyChart is used to connect with patients for Virtual Visits (Telemedicine).  Patients are able to view lab/test results, encounter notes, upcoming appointments, etc.  Non-urgent messages can be sent to your provider as well.   To learn more about what you can do with MyChart, go to [email or web address].    Your next appointment:   Please schedule a 6 MONTH follow up appointment with Dr. Beckie Salts.    The format for your next appointment:   In Person  Provider:   Cristopher Peru, MD{or one of the following Advanced Practice Providers on your designated Care Team:   Tommye Standard, [place] Legrand Como "Jonni Sanger" Chalmers Cater, [place]   Important Information About Sugar

## 2021-11-24 NOTE — Telephone Encounter (Signed)
The PA that was previously attempted to be submitted had (apparently) never populated clinical questions and was quietly archived by a member of the PA Team without any additional follow up or notification.  Submitted a NEW Prior Authorization request to  [employer]  for  BOTOX 200  via CoverMyMeds. Request submitted as URGENT. Will update once we receive a response.  Key: [id number]   *NOTE* If second request fails then [employer] will need to be contacted directly to initiate PA.

## 2021-11-25 NOTE — Telephone Encounter (Signed)
Patient advised of delay. Per patient she had a migraine yesterday but she is doing fine right now.

## 2021-12-01 NOTE — Telephone Encounter (Signed)
Please follow up on determination

## 2021-12-06 DIAGNOSIS — R400 Somnolence: Secondary | ICD-10-CM | POA: Diagnosis not present

## 2021-12-06 DIAGNOSIS — G4733 Obstructive sleep apnea (adult) (pediatric): Secondary | ICD-10-CM | POA: Diagnosis not present

## 2021-12-06 DIAGNOSIS — F341 Dysthymic disorder: Secondary | ICD-10-CM | POA: Diagnosis not present

## 2021-12-06 NOTE — Telephone Encounter (Signed)
PA questions still have not populated via CMM. Reached out to [employer] to initiate PA process, rep stated that he will fax the form directly to me. Will await delivery.

## 2021-12-06 NOTE — Telephone Encounter (Signed)
PA form from [employer] has been completed and faxed back along with supporting chart notes. Will await determination.  Fax# [phone] Phone# [phone] (opt. 2)

## 2021-12-08 ENCOUNTER — Other Ambulatory Visit (HOSPITAL_COMMUNITY): Payer: Self-pay

## 2021-12-08 NOTE — Telephone Encounter (Signed)
Test claim reveals that pt can fill through [facility], however her copay will be $290.06.  A BIV dose not appear to have been initiated through the [employer] portal at the time of the initial request, will do so now in order to determine potential costs through medical benefits.  BIV [id number] has been submitted to the [employer]otox[employer] portal, results are expected to be received in 2 business days.

## 2021-12-08 NOTE — Telephone Encounter (Signed)
Fax received from [facility] [employer] Botox 200 units approved 12/07/21-02/28/23. Approval under Medicare Part D

## 2021-12-09 ENCOUNTER — Telehealth: Payer: Self-pay | Admitting: Anesthesiology

## 2021-12-09 NOTE — Telephone Encounter (Signed)
Tried calling patient, no answer, Fax received from [facility] [employer] Botox 200 units approved 12/07/21-02/28/23. Approval under Medicare Part D.  Per PA team; Test claim reveals that pt can fill through [facility], however her copay will be $290.06.   A BIV dose not appear to have been initiated through the [employer] portal at the time of the initial request, will do so now in order to determine potential costs through medical benefits.  BIV [id number] has been submitted to the [employer]otox[employer] portal, results are expected to be received in 2 business days.   LMOVM for patient to call the office back.

## 2021-12-09 NOTE — Telephone Encounter (Signed)
Patient requesting call back regarding Botox.

## 2021-12-14 NOTE — Telephone Encounter (Signed)
   [employer]otox[employer] Benefits scanned to chart

## 2021-12-23 NOTE — Telephone Encounter (Signed)
Patient advised of Botox not covered under medical Benefits. And the copay $290 under pharmacy benefits.  Per Patient that copay is too expensive right now.   Please advise of next steps.

## 2022-01-06 ENCOUNTER — Ambulatory Visit: Payer: PPO | Admitting: Nurse Practitioner

## 2022-01-06 DIAGNOSIS — R400 Somnolence: Secondary | ICD-10-CM | POA: Diagnosis not present

## 2022-01-06 DIAGNOSIS — F341 Dysthymic disorder: Secondary | ICD-10-CM | POA: Diagnosis not present

## 2022-01-06 DIAGNOSIS — G4733 Obstructive sleep apnea (adult) (pediatric): Secondary | ICD-10-CM | POA: Diagnosis not present

## 2022-01-13 ENCOUNTER — Ambulatory Visit (INDEPENDENT_AMBULATORY_CARE_PROVIDER_SITE_OTHER): Payer: PPO | Admitting: Neurology

## 2022-01-13 DIAGNOSIS — G43709 Chronic migraine without aura, not intractable, without status migrainosus: Secondary | ICD-10-CM | POA: Diagnosis not present

## 2022-01-13 MED ORDER — ONABOTULINUMTOXINA 100 UNITS IJ SOLR
200.0000 [IU] | Freq: Once | INTRAMUSCULAR | Status: AC
  Administered 2022-01-13: 155 [IU] via INTRAMUSCULAR

## 2022-01-13 NOTE — Progress Notes (Signed)
[facility]  ° °Procedure Note Botox ° °Attending: Dr. Esequiel Kleinfelter ° °Preoperative Diagnosis(es): Chronic migraine ° °Consent obtained from: The patient °Benefits discussed included, but were not limited to decreased muscle tightness, increased joint range of motion, and decreased pain.  Risk discussed included, but were not limited pain and discomfort, bleeding, bruising, excessive weakness, venous thrombosis, muscle atrophy and dysphagia.  Anticipated outcomes of the procedure as well as he risks and benefits of the alternatives to the procedure, and the roles and tasks of the personnel to be involved, were discussed with the patient, and the patient consents to the procedure and agrees to proceed. A copy of the patient medication guide was given to the patient which explains the blackbox warning. ° °Patients identity and treatment sites confirmed Yes.  . ° °Details of Procedure: °Skin was cleaned with alcohol. Prior to injection, the needle plunger was aspirated to make sure the needle was not within a blood vessel.  There was no blood retrieved on aspiration.   ° °Following is a summary of the muscles injected  And the amount of Botulinum toxin used: ° °Dilution °200 units of Botox was reconstituted with 4 ml of preservative free normal saline. °Time of reconstitution: At the time of the office visit (<30 minutes prior to injection)  ° °Injections  °155 total units of Botox was injected with a 30 gauge needle. ° °Injection Sites: °L occipitalis: 15 units- 3 sites  °R occiptalis: 15 units- 3 sites ° °L upper trapezius: 15 units- 3 sites °R upper trapezius: 15 units- 3 sits          °L paraspinal: 10 units- 2 sites °R paraspinal: 10 units- 2 sites ° °Face °L frontalis(2 injection sites):10 units   °R frontalis(2 injection sites):10 units         °L corrugator: 5 units   °R corrugator: 5 units           °Procerus: 5 units   °L temporalis: 20 units °R temporalis: 20 units  ° °Agent:  °200 units of botulinum Type  A (Onobotulinum Toxin type A) was reconstituted with 4 ml of preservative free normal saline.  °Time of reconstitution: At the time of the office visit (<30 minutes prior to injection)  ° ° ° Total injected (Units):  155 ° Total wasted (Units):  45 ° °Patient tolerated procedure well without complications.   °Reinjection is anticipated in 3 months. ° ° °

## 2022-01-27 DIAGNOSIS — K5903 Drug induced constipation: Secondary | ICD-10-CM | POA: Diagnosis not present

## 2022-01-27 DIAGNOSIS — M25569 Pain in unspecified knee: Secondary | ICD-10-CM | POA: Diagnosis not present

## 2022-01-27 DIAGNOSIS — G43109 Migraine with aura, not intractable, without status migrainosus: Secondary | ICD-10-CM | POA: Diagnosis not present

## 2022-01-27 DIAGNOSIS — Z79891 Long term (current) use of opiate analgesic: Secondary | ICD-10-CM | POA: Diagnosis not present

## 2022-01-27 DIAGNOSIS — M2550 Pain in unspecified joint: Secondary | ICD-10-CM | POA: Diagnosis not present

## 2022-01-27 DIAGNOSIS — M797 Fibromyalgia: Secondary | ICD-10-CM | POA: Diagnosis not present

## 2022-01-27 DIAGNOSIS — E282 Polycystic ovarian syndrome: Secondary | ICD-10-CM | POA: Diagnosis not present

## 2022-01-27 DIAGNOSIS — G894 Chronic pain syndrome: Secondary | ICD-10-CM | POA: Diagnosis not present

## 2022-02-05 DIAGNOSIS — F341 Dysthymic disorder: Secondary | ICD-10-CM | POA: Diagnosis not present

## 2022-02-05 DIAGNOSIS — R400 Somnolence: Secondary | ICD-10-CM | POA: Diagnosis not present

## 2022-02-05 DIAGNOSIS — G4733 Obstructive sleep apnea (adult) (pediatric): Secondary | ICD-10-CM | POA: Diagnosis not present

## 2022-02-17 ENCOUNTER — Ambulatory Visit: Payer: PPO | Admitting: Nurse Practitioner

## 2022-02-17 NOTE — Progress Notes (Deleted)
Office Visit    Patient Name: Krystal French Date of Encounter: 02/17/2022  Primary Care Provider:  Johna Roles, PA Primary Cardiologist:  None  Chief Complaint    46 year old female with a history of SVT, palpitations, OSA, fibromyalgia, anxiety, bipolar disorder, and obesity who presents for follow-up related to palpitations.  Past Medical History    Past Medical History:  Diagnosis Date   Anxiety    Bipolar 2 disorder ([place])    Fibromyalgia    Migraine    Morbid obesity ([place])    Neuropathy    Polycystic ovaries    Sleep apnea    CPAP   Past Surgical History:  Procedure Laterality Date   CHOLECYSTECTOMY     knee arthroscopic     WISDOM TOOTH EXTRACTION      Allergies  Allergies  Allergen Reactions   Codeine    Morphine And Related    Latex     Makes patient skin peal off   Sulfa Antibiotics     coma    History of Present Illness    46 year old female with the above past medical history including SVT, palpitations, OSA, fibromyalgia, anxiety, bipolar disorder, and obesity.  She was initially evaluated by Dr. Percival Spanish in 10/2021 in the setting of palpitations.  Outpatient cardiac monitor in 10/2021 revealed frequent runs of SVT.  She was referred to EP.  She was last seen in the office on 11/18/2021 by Dr. Lovena Le who recommended trial of metoprolol.  It was noted that should she continue to have symptoms despite beta-blocker therapy, catheter ablation could be considered.  Weight loss was encouraged.  She presents today for follow-up.  Since her last visit  SVT/palpitations: Obesity: Disposition:   Home Medications    Current Outpatient Medications  Medication Sig Dispense Refill   ARIPiprazole (ABILIFY) 10 MG tablet Take 10 mg by mouth daily at 2 PM.     Armodafinil 200 MG TABS Take 1 tablet by mouth daily. Patient states 250 MG daily     BELBUCA 750 MCG FILM Take 750 mcg by mouth 2 (two) times daily.     clonazePAM (KLONOPIN) 1 MG tablet 3  (three) times daily as needed.     cyclobenzaprine (FLEXERIL) 10 MG tablet      fish oil-omega-3 fatty acids 1000 MG capsule Take 1 g by mouth daily.     fluticasone (FLONASE) 50 MCG/ACT nasal spray fluticasone propionate 50 mcg/actuation nasal spray,suspension     folic acid (FOLVITE) 1 MG tablet Take 1 mg by mouth daily.     HYDROmorphone (DILAUDID) 4 MG tablet Take 4 mg by mouth at bedtime.     lamoTRIgine (LAMICTAL) 150 MG tablet Take 150 mg by mouth at bedtime.     LARIN FE 1.5/30 1.5-30 MG-MCG tablet Take 1 tablet by mouth daily.     metFORMIN (GLUCOPHAGE) 500 MG tablet Take 500 mg by mouth daily.     Methylphenidate HCl ER, OSM, 72 MG TBCR      metoprolol succinate (TOPROL XL) 25 MG 24 hr tablet Take 1 tablet (25 mg total) by mouth daily. 90 tablet 3   NARCAN 4 MG/0.1ML LIQD nasal spray kit SMARTSIG:1 Spray(s) Both Nares Once PRN     ondansetron (ZOFRAN) 4 MG tablet Take 4 mg by mouth every 8 (eight) hours as needed for nausea.     prazosin (MINIPRESS) 2 MG capsule      Ubrogepant (UBRELVY) 100 MG TABS      zaleplon (SONATA) 10  MG capsule Take 10 mg by mouth at bedtime as needed.     zonisamide (ZONEGRAN) 100 MG capsule Take 200 mg by mouth at bedtime.      No current facility-administered medications for this visit.     Review of Systems    ***.  All other systems reviewed and are otherwise negative except as noted above.    Physical Exam    VS:  There were no vitals taken for this visit. , BMI There is no height or weight on file to calculate BMI.     GEN: Well nourished, well developed, in no acute distress. HEENT: normal. Neck: Supple, no JVD, carotid bruits, or masses. Cardiac: RRR, no murmurs, rubs, or gallops. No clubbing, cyanosis, edema.  Radials/DP/PT 2+ and equal bilaterally.  Respiratory:  Respirations regular and unlabored, clear to auscultation bilaterally. GI: Soft, nontender, nondistended, BS + x 4. MS: no deformity or atrophy. Skin: warm and dry, no  rash. Neuro:  Strength and sensation are intact. Psych: Normal affect.  Accessory Clinical Findings    ECG personally reviewed by me today - *** - no acute changes.   Lab Results  Component Value Date   WBC 14.7 (H) 10/11/2021   HGB 14.5 10/11/2021   HCT 44.5 10/11/2021   MCV 79.5 (L) 10/11/2021   PLT 319 10/11/2021   Lab Results  Component Value Date   CREATININE 0.92 10/11/2021   BUN 10 10/11/2021   NA 135 10/11/2021   K 4.2 10/11/2021   CL 104 10/11/2021   CO2 20 (L) 10/11/2021   Lab Results  Component Value Date   ALT 12 09/07/2012   AST 11 09/07/2012   ALKPHOS 77 09/07/2012   BILITOT 0.2 (L) 09/07/2012   No results found for: "CHOL", "HDL", "LDLCALC", "LDLDIRECT", "TRIG", "CHOLHDL"  No results found for: "HGBA1C"  Assessment & Plan    1.  ***  No BP recorded.  {Refresh Note OR Click here to enter BP  :1}***   Lenna Sciara, NP 02/17/2022, 6:14 AM

## 2022-03-01 DIAGNOSIS — F900 Attention-deficit hyperactivity disorder, predominantly inattentive type: Secondary | ICD-10-CM | POA: Diagnosis not present

## 2022-03-01 DIAGNOSIS — F3131 Bipolar disorder, current episode depressed, mild: Secondary | ICD-10-CM | POA: Diagnosis not present

## 2022-03-01 DIAGNOSIS — G4730 Sleep apnea, unspecified: Secondary | ICD-10-CM | POA: Diagnosis not present

## 2022-03-08 DIAGNOSIS — R400 Somnolence: Secondary | ICD-10-CM | POA: Diagnosis not present

## 2022-03-08 DIAGNOSIS — G4733 Obstructive sleep apnea (adult) (pediatric): Secondary | ICD-10-CM | POA: Diagnosis not present

## 2022-03-08 DIAGNOSIS — F341 Dysthymic disorder: Secondary | ICD-10-CM | POA: Diagnosis not present

## 2022-03-14 ENCOUNTER — Telehealth: Payer: Self-pay

## 2022-03-14 NOTE — Telephone Encounter (Signed)
Patient advised of Dr.Jaffe, I would look to see if Emgality, Lenoria Chime or Vyepti are options.  PA team please start a PA for Emgality.

## 2022-03-14 NOTE — Telephone Encounter (Signed)
Patient advise of her Botox is under medical as a Buy and bill.   Patient right now can not afford the Botox right now is there something else she can take.

## 2022-03-14 NOTE — Telephone Encounter (Signed)
Patient is calling in stating that she called about her BOTOX was told before scheduling that if we order it that insurance would reimburse [place] in full. Just received a bill for $215.01 and wants to know why it was this much.

## 2022-03-29 ENCOUNTER — Ambulatory Visit: Payer: PPO | Admitting: General Practice

## 2022-03-30 DIAGNOSIS — G4733 Obstructive sleep apnea (adult) (pediatric): Secondary | ICD-10-CM | POA: Diagnosis not present

## 2022-03-30 DIAGNOSIS — F341 Dysthymic disorder: Secondary | ICD-10-CM | POA: Diagnosis not present

## 2022-03-30 DIAGNOSIS — R400 Somnolence: Secondary | ICD-10-CM | POA: Diagnosis not present

## 2022-04-08 DIAGNOSIS — F341 Dysthymic disorder: Secondary | ICD-10-CM | POA: Diagnosis not present

## 2022-04-08 DIAGNOSIS — R400 Somnolence: Secondary | ICD-10-CM | POA: Diagnosis not present

## 2022-04-08 DIAGNOSIS — G4733 Obstructive sleep apnea (adult) (pediatric): Secondary | ICD-10-CM | POA: Diagnosis not present

## 2022-04-14 ENCOUNTER — Ambulatory Visit: Payer: PPO | Admitting: Neurology

## 2022-04-21 DIAGNOSIS — K5903 Drug induced constipation: Secondary | ICD-10-CM | POA: Diagnosis not present

## 2022-04-21 DIAGNOSIS — Z79899 Other long term (current) drug therapy: Secondary | ICD-10-CM | POA: Diagnosis not present

## 2022-04-21 DIAGNOSIS — G43109 Migraine with aura, not intractable, without status migrainosus: Secondary | ICD-10-CM | POA: Diagnosis not present

## 2022-04-21 DIAGNOSIS — E282 Polycystic ovarian syndrome: Secondary | ICD-10-CM | POA: Diagnosis not present

## 2022-04-21 DIAGNOSIS — M25569 Pain in unspecified knee: Secondary | ICD-10-CM | POA: Diagnosis not present

## 2022-04-21 DIAGNOSIS — G894 Chronic pain syndrome: Secondary | ICD-10-CM | POA: Diagnosis not present

## 2022-04-21 DIAGNOSIS — M2550 Pain in unspecified joint: Secondary | ICD-10-CM | POA: Diagnosis not present

## 2022-04-21 DIAGNOSIS — M797 Fibromyalgia: Secondary | ICD-10-CM | POA: Diagnosis not present

## 2022-04-21 DIAGNOSIS — Z79891 Long term (current) use of opiate analgesic: Secondary | ICD-10-CM | POA: Diagnosis not present

## 2022-04-26 ENCOUNTER — Other Ambulatory Visit (HOSPITAL_COMMUNITY): Payer: Self-pay

## 2022-04-26 NOTE — Telephone Encounter (Signed)
Patient Advocate Encounter   Received notification that prior authorization for Emgality '120MG'$ /ML auto-injectors (migraine) is required.   PA submitted on 04/26/2022 Key [id number] Insurance:  [place] [employer] Electronic Prior Authorization Form 2017 NCPDP Status is pending       Lyndel Safe, [place] Patient Advocate Specialist [place] Patient Advocate Team Direct Number: [phone]  Fax: [phone]

## 2022-05-02 NOTE — Telephone Encounter (Signed)
Patient Advocate Encounter  Prior Authorization for Emgality '120MG'$ /ML auto-injectors (migraine) has been approved.    PA# [id number] Insurance [employer] Electronic Prior Authorization Form Effective dates: 04/26/2022 through 10/25/2022      Lyndel Safe, [place] Patient Advocate Specialist [place] Patient Advocate Team Direct Number: [phone]  Fax: [phone]

## 2022-05-03 MED ORDER — EMGALITY 120 MG/ML ~~LOC~~ SOAJ: 240.0000 mg | SUBCUTANEOUS | 0 refills | Status: DC

## 2022-05-03 NOTE — Telephone Encounter (Signed)
Patient advised. Per Patient [facility] is unable to get [employer]. Please fax it over.    Script printed and fx.

## 2022-05-03 NOTE — Addendum Note (Signed)
Addended by: Venetia Night on: 05/03/2022 02:42 PM   Modules accepted: Orders

## 2022-05-07 DIAGNOSIS — R400 Somnolence: Secondary | ICD-10-CM | POA: Diagnosis not present

## 2022-05-07 DIAGNOSIS — F341 Dysthymic disorder: Secondary | ICD-10-CM | POA: Diagnosis not present

## 2022-05-07 DIAGNOSIS — G4733 Obstructive sleep apnea (adult) (pediatric): Secondary | ICD-10-CM | POA: Diagnosis not present

## 2022-05-26 ENCOUNTER — Ambulatory Visit: Payer: PPO | Admitting: Student

## 2022-05-26 ENCOUNTER — Ambulatory Visit: Payer: PPO | Admitting: Internal Medicine

## 2022-05-30 DIAGNOSIS — F3131 Bipolar disorder, current episode depressed, mild: Secondary | ICD-10-CM | POA: Diagnosis not present

## 2022-05-30 DIAGNOSIS — F900 Attention-deficit hyperactivity disorder, predominantly inattentive type: Secondary | ICD-10-CM | POA: Diagnosis not present

## 2022-05-30 DIAGNOSIS — G4730 Sleep apnea, unspecified: Secondary | ICD-10-CM | POA: Diagnosis not present

## 2022-06-02 NOTE — Progress Notes (Deleted)
NEUROLOGY FOLLOW UP OFFICE NOTE  Krystal French [id number]  Assessment/Plan:   Chronic migraine without aura, without status migrainosus, not intractable Migraine with aura, without status migrainosus, not intractable   Migraine prevention:  *** Migraine rescue:  *** Limit use of pain relievers to no more than 2 days out of week to prevent risk of rebound or medication-overuse headache. Keep headache diary Follow up ***      Subjective:  Krystal French is a 47year old female with Bipolar 2 disorder, fibromyalgia, sleep apnea and anxiety who follows up for migraines.  UPDATE: She had one round of Botox but ***.  She was then started on Emgality. Intensity:  *** Duration:  *** Frequency:  *** Frequency of abortive medication: *** Current NSAIDS/analgesics:  hydromorphone (fibromyalgia but will treat acute migraines) Current triptans:  CANNOT TAKE THEM DUE TO CAUSING PALPITATIONS Current ergotamine:  none Current anti-emetic:  Zofran 4mg  Current muscle relaxants:  cyclobenzaprine 10mg  Current Antihypertensive medications:  none Current Antidepressant medications:  none Current Anticonvulsant medications:  zonisamide 200mg  daily, lamotrigine 150mg  QHS Current anti-CGRP:  Emgality, Ubrelvy 100mg  Current Vitamins/Herbal/Supplements:  fish oil, D Current Antihistamines/Decongestants:  Flonase Other therapy:  cognitive behavioral therapy Hormone/birth control:  LARIN FE Other medications:  armodafinil, buprenorphine, clonazepam, zaleplon  Caffeine:  1 cup of coffee daily, 8-12 oz Dr. Malachi Bonds daily Alcohol:  none Smoking: none Diet:  at least one liter water daily.  Sometimes skips meals Exercise:  tries to use the pool but not routine Depression:  yes; Anxiety:  yes.  Has Bipolar disorder Other pain:  fibromyalgia Sleep hygiene: broken - wakes up often during the night.   HISTORY:  Onset:  47 years old Location:  usually right frontal region, occasionally band across  back of head Quality:  sharp, steady Intensity:  severe.   Aura:  phantosmia (sometimes smells roses or excrement) Prodrome:  photosensitivity, phono-sensitivity  Associated symptoms:  nausea, vomiting, photophobia, phonophobia, osmophobia, eyes water.  She denies associated visual disturbance, unilateral numbness or weakness. Duration:  6 hours to all day (couple of hours with Roselyn Meier but can only afford 2-4 tablets a month) Frequency:  15-20 days a month Often wakes up in middle of night with them.   Triggers:  soy, menses (has a migraine first day of period), change in barometric pressure/weather Relieving factors:  rest in dark and quiet room Activity:  cannot function about 3 days a week   She has previously had multiple brain MRIs.  CT head from 03/24/2015 personally reviewed was normal.   Past NSAIDS/analgesics:  ibuprofen, acetaminophen, naproxen, Fioricet, Excedrin Past abortive triptans:  sumatriptan tab/[place], rizatriptan - cannot take triptans because they cause palpitations Past abortive ergotamine:  yes but cannot remember name Past muscle relaxants:  none Past anti-emetic:  promethazine (drowsy), Reglan, Compazine Past antihypertensive medications:  propranolol Past antidepressant medications:  venlafaxine, duloxetine, sertraline Past anticonvulsant medications:  divalproex, topiramate, lamotrigine Past anti-CGRP:  Ubrelvy, Aimovig, Ajovy (effective but cannot afford) Past vitamins/Herbal/Supplements:  feverfew, magnesium Past antihistamines/decongestants:  none Other past therapies:  Botox (1 round ***)      Multiple financial constraints and cannot afford many medications such as Botox, Aimovig, Ajovy     Family history of migraines:  maternal aunts  PAST MEDICAL HISTORY: Past Medical History:  Diagnosis Date   Anxiety    Bipolar 2 disorder ([place])    Fibromyalgia    Migraine    Morbid obesity ([place])    Neuropathy    Polycystic ovaries  Sleep apnea    CPAP     MEDICATIONS: Current Outpatient Medications on File Prior to Visit  Medication Sig Dispense Refill   ARIPiprazole (ABILIFY) 10 MG tablet Take 10 mg by mouth daily at 2 PM.     Armodafinil 200 MG TABS Take 1 tablet by mouth daily. Patient states 250 MG daily     BELBUCA 750 MCG FILM Take 750 mcg by mouth 2 (two) times daily.     clonazePAM (KLONOPIN) 1 MG tablet 3 (three) times daily as needed.     cyclobenzaprine (FLEXERIL) 10 MG tablet      fish oil-omega-3 fatty acids 1000 MG capsule Take 1 g by mouth daily.     fluticasone (FLONASE) 50 MCG/ACT nasal spray fluticasone propionate 50 mcg/actuation nasal spray,suspension     folic acid (FOLVITE) 1 MG tablet Take 1 mg by mouth daily.     Galcanezumab-gnlm (EMGALITY) 120 MG/ML SOAJ Inject 240 mg into the skin every 28 (twenty-eight) days. 2 mL 0   HYDROmorphone (DILAUDID) 4 MG tablet Take 4 mg by mouth at bedtime.     lamoTRIgine (LAMICTAL) 150 MG tablet Take 150 mg by mouth at bedtime.     LARIN FE 1.5/30 1.5-30 MG-MCG tablet Take 1 tablet by mouth daily.     metFORMIN (GLUCOPHAGE) 500 MG tablet Take 500 mg by mouth daily.     Methylphenidate HCl ER, OSM, 72 MG TBCR      metoprolol succinate (TOPROL XL) 25 MG 24 hr tablet Take 1 tablet (25 mg total) by mouth daily. 90 tablet 3   NARCAN 4 MG/0.1ML LIQD nasal spray kit SMARTSIG:1 Spray(s) Both Nares Once PRN     ondansetron (ZOFRAN) 4 MG tablet Take 4 mg by mouth every 8 (eight) hours as needed for nausea.     prazosin (MINIPRESS) 2 MG capsule      Ubrogepant (UBRELVY) 100 MG TABS      zaleplon (SONATA) 10 MG capsule Take 10 mg by mouth at bedtime as needed.     zonisamide (ZONEGRAN) 100 MG capsule Take 200 mg by mouth at bedtime.      No current facility-administered medications on file prior to visit.    ALLERGIES: Allergies  Allergen Reactions   Codeine    Morphine And Related    Latex     Makes patient skin peal off   Sulfa Antibiotics     coma    FAMILY HISTORY: Family  History  Problem Relation Age of Onset   Fibromyalgia Mother    Parkinsonism Father    Sleep apnea Father    High Cholesterol Father    Dementia Father    Hypertension Father    ADD / ADHD Father    CAD Father 60   Dementia Paternal Grandfather       Objective:  *** General: No acute distress.  Patient appears ***-groomed.   Head:  Normocephalic/atraumatic Eyes:  Fundi examined but not visualized Neck: supple, no paraspinal tenderness, full range of motion Heart:  Regular rate and rhythm Lungs:  Clear to auscultation bilaterally Back: No paraspinal tenderness Neurological Exam: alert and oriented to person, place, and time.  Speech fluent and not dysarthric, language intact.  CN II-XII intact. Bulk and tone normal, muscle strength 5/5 throughout.  Sensation to light touch intact.  Deep tendon reflexes 2+ throughout, toes downgoing.  Finger to nose testing intact.  Gait normal, Romberg negative.   Metta Clines, DO  CC: ***

## 2022-06-07 DIAGNOSIS — G4733 Obstructive sleep apnea (adult) (pediatric): Secondary | ICD-10-CM | POA: Diagnosis not present

## 2022-06-07 DIAGNOSIS — F341 Dysthymic disorder: Secondary | ICD-10-CM | POA: Diagnosis not present

## 2022-06-07 DIAGNOSIS — R400 Somnolence: Secondary | ICD-10-CM | POA: Diagnosis not present

## 2022-06-14 ENCOUNTER — Ambulatory Visit: Payer: PPO | Admitting: Neurology

## 2022-06-14 DIAGNOSIS — F319 Bipolar disorder, unspecified: Secondary | ICD-10-CM | POA: Diagnosis not present

## 2022-06-14 DIAGNOSIS — R030 Elevated blood-pressure reading, without diagnosis of hypertension: Secondary | ICD-10-CM | POA: Diagnosis not present

## 2022-06-14 DIAGNOSIS — G4733 Obstructive sleep apnea (adult) (pediatric): Secondary | ICD-10-CM | POA: Diagnosis not present

## 2022-06-30 DIAGNOSIS — F341 Dysthymic disorder: Secondary | ICD-10-CM | POA: Diagnosis not present

## 2022-06-30 DIAGNOSIS — R400 Somnolence: Secondary | ICD-10-CM | POA: Diagnosis not present

## 2022-06-30 DIAGNOSIS — G4733 Obstructive sleep apnea (adult) (pediatric): Secondary | ICD-10-CM | POA: Diagnosis not present

## 2022-07-06 IMAGING — US US PELVIS COMPLETE WITH TRANSVAGINAL
1 series · 14 of 25 positions shown · non-contrast
Comparison: 04/27/2009

CLINICAL DATA: Dysmenorrhea, LMP 08/09/2021



[Series 1: us pelvis complete with transvaginal · 0.21mm/px · 14 of 49 slices shown]
[im 1/49]
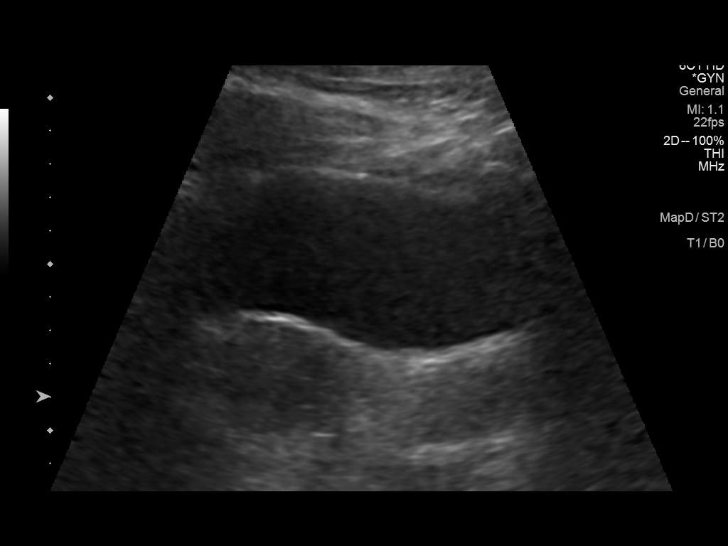
[im 5/49]
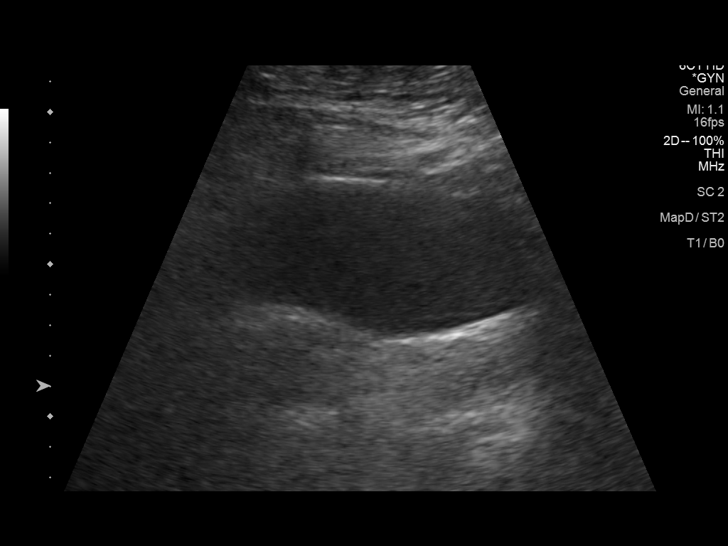
[im 9/49]
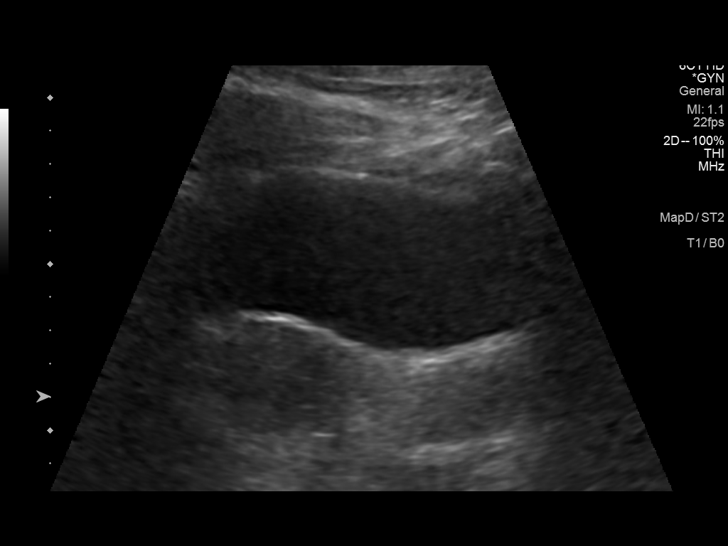
[im 13/49]
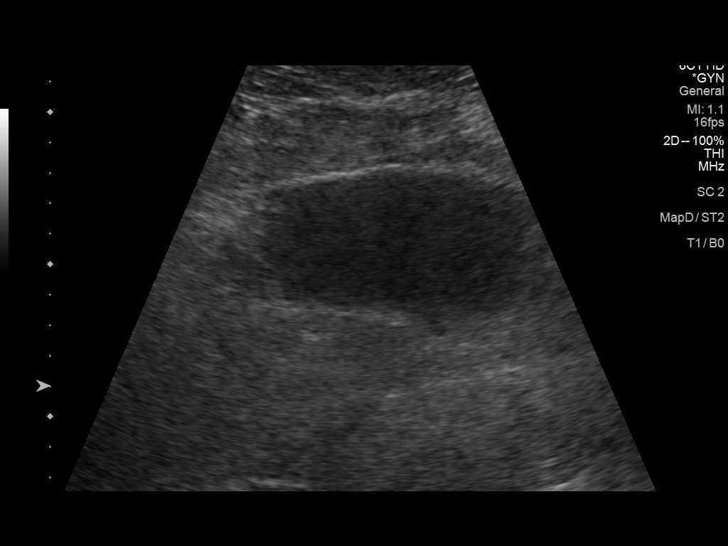
[im 17/49]
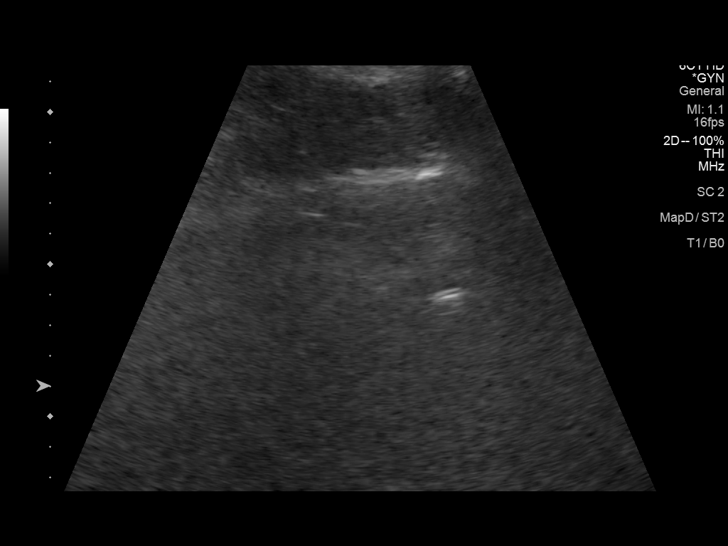
[im 19/49]
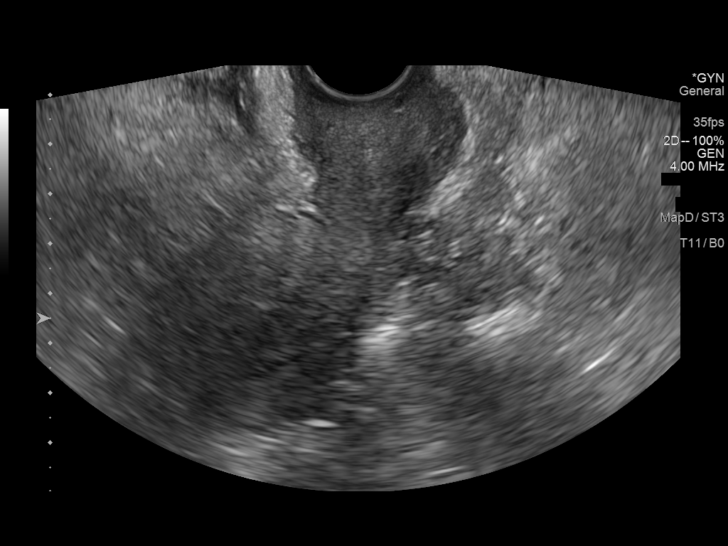
[im 23/49]
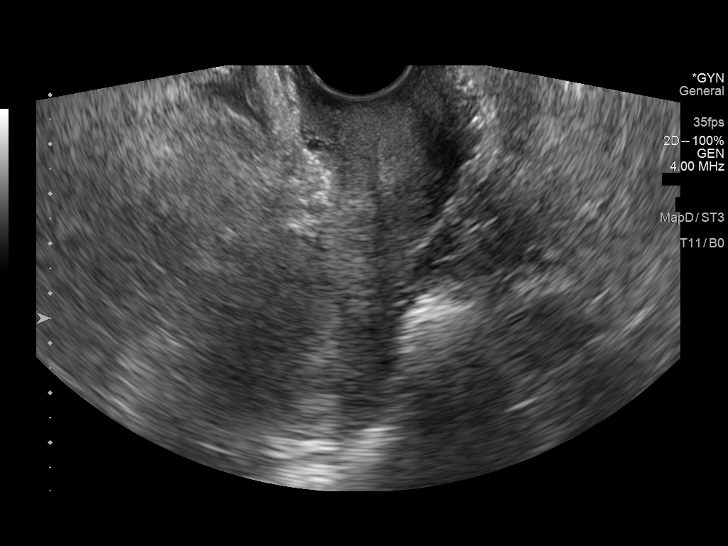
[im 27/49]
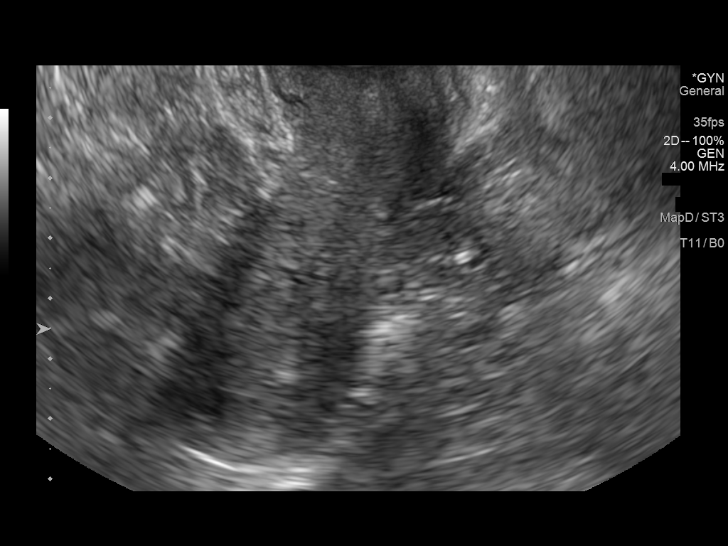
[im 31/49]
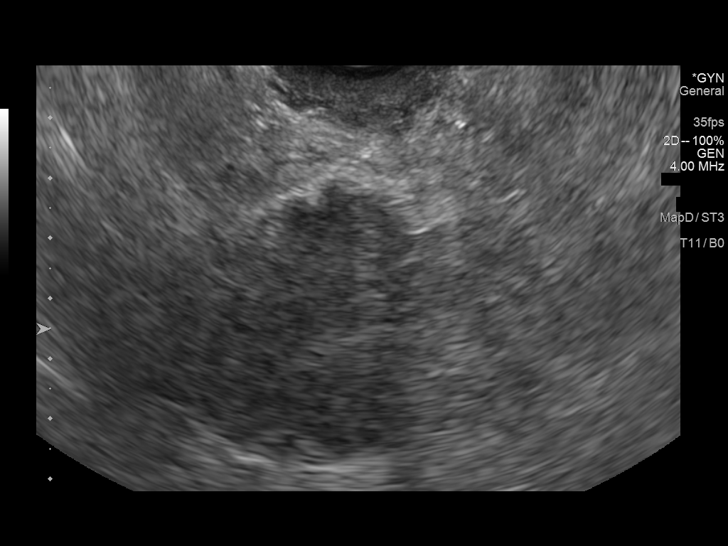
[im 33/49]
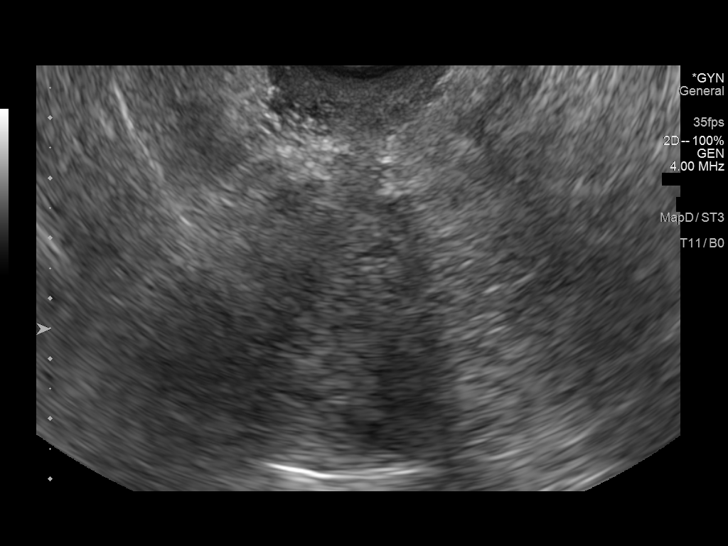
[im 37/49]
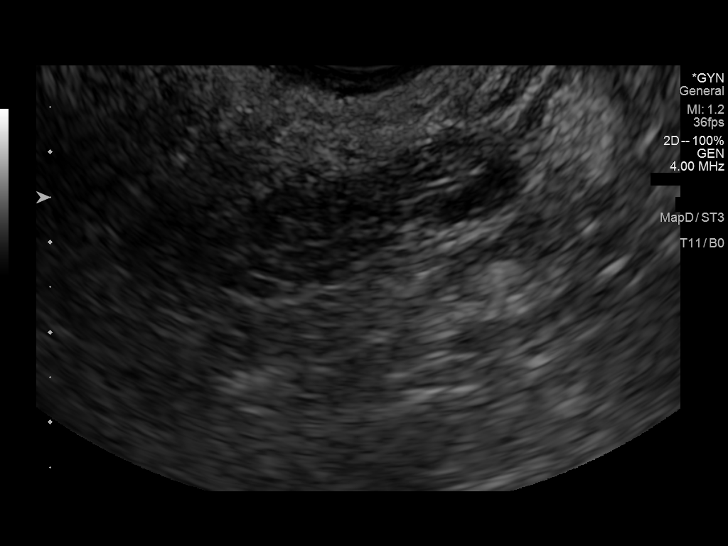
[im 41/49]
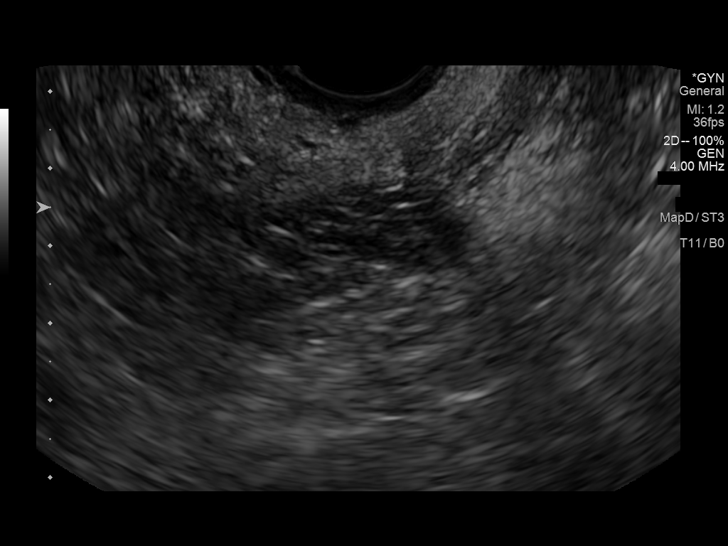
[im 45/49]
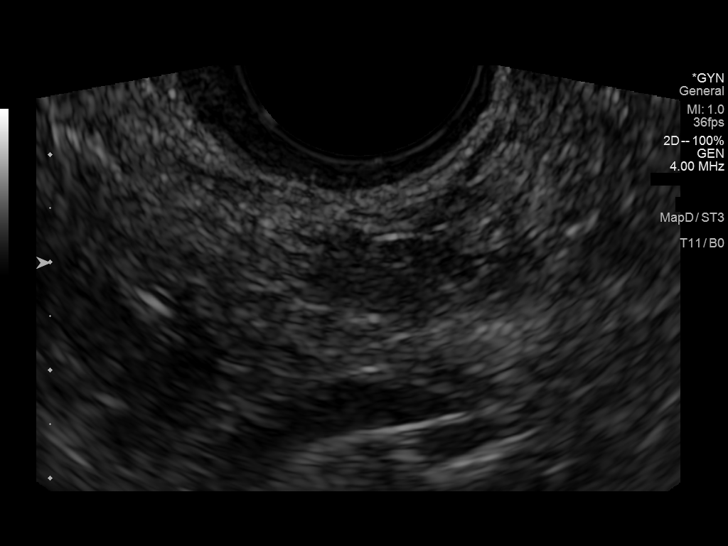
[im 49/49]
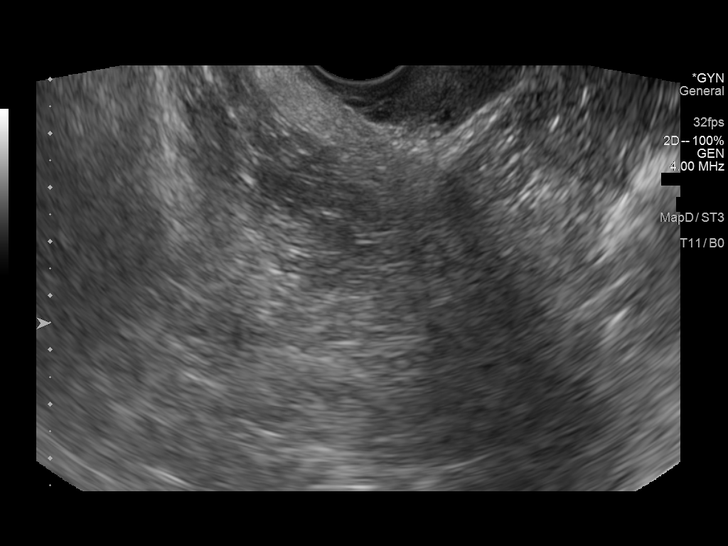

[14 of 25 positions shown; findings below may reference images not displayed]

FINDINGS: Uterus

Measurements: 8.4 x 3.4 x 4.0 cm = volume: 59 mL. Anteverted.
Heterogeneous myometrium. No focal mass.

Endometrium

Thickness: 4 mm.  No endometrial fluid or mass

Right ovary

Not visualized, likely obscured by bowel

Left ovary

Measurements: 2.8 x 1.3 x 1.3 cm = volume: 2.5 mL. Normal morphology
without mass

Other findings

No free pelvic fluid.  No adnexal masses.
IMPRESSION: Nonvisualization of RIGHT ovary.

Otherwise normal exam.

## 2022-07-07 DIAGNOSIS — F341 Dysthymic disorder: Secondary | ICD-10-CM | POA: Diagnosis not present

## 2022-07-07 DIAGNOSIS — R400 Somnolence: Secondary | ICD-10-CM | POA: Diagnosis not present

## 2022-07-07 DIAGNOSIS — G4733 Obstructive sleep apnea (adult) (pediatric): Secondary | ICD-10-CM | POA: Diagnosis not present

## 2022-07-12 ENCOUNTER — Other Ambulatory Visit (HOSPITAL_COMMUNITY)
Admission: RE | Admit: 2022-07-12 | Discharge: 2022-07-12 | Disposition: A | Discharge status: Home / Self Care | Payer: PPO | Source: Ambulatory Visit | Attending: Nurse Practitioner | Admitting: Nurse Practitioner

## 2022-07-12 ENCOUNTER — Other Ambulatory Visit: Payer: Self-pay | Admitting: Nurse Practitioner

## 2022-07-12 DIAGNOSIS — Z01419 Encounter for gynecological examination (general) (routine) without abnormal findings: Secondary | ICD-10-CM | POA: Diagnosis not present

## 2022-07-12 DIAGNOSIS — E282 Polycystic ovarian syndrome: Secondary | ICD-10-CM | POA: Diagnosis not present

## 2022-07-12 DIAGNOSIS — Z124 Encounter for screening for malignant neoplasm of cervix: Secondary | ICD-10-CM | POA: Diagnosis not present

## 2022-07-12 DIAGNOSIS — Z1211 Encounter for screening for malignant neoplasm of colon: Secondary | ICD-10-CM | POA: Diagnosis not present

## 2022-07-12 DIAGNOSIS — Z1151 Encounter for screening for human papillomavirus (HPV): Secondary | ICD-10-CM | POA: Diagnosis not present

## 2022-07-12 DIAGNOSIS — Z8742 Personal history of other diseases of the female genital tract: Secondary | ICD-10-CM | POA: Diagnosis not present

## 2022-07-12 DIAGNOSIS — Z9189 Other specified personal risk factors, not elsewhere classified: Secondary | ICD-10-CM | POA: Diagnosis not present

## 2022-07-12 DIAGNOSIS — Z3041 Encounter for surveillance of contraceptive pills: Secondary | ICD-10-CM | POA: Diagnosis not present

## 2022-07-14 DIAGNOSIS — M797 Fibromyalgia: Secondary | ICD-10-CM | POA: Diagnosis not present

## 2022-07-14 DIAGNOSIS — G894 Chronic pain syndrome: Secondary | ICD-10-CM | POA: Diagnosis not present

## 2022-07-14 DIAGNOSIS — G43109 Migraine with aura, not intractable, without status migrainosus: Secondary | ICD-10-CM | POA: Diagnosis not present

## 2022-07-14 DIAGNOSIS — Z79891 Long term (current) use of opiate analgesic: Secondary | ICD-10-CM | POA: Diagnosis not present

## 2022-07-14 DIAGNOSIS — M2550 Pain in unspecified joint: Secondary | ICD-10-CM | POA: Diagnosis not present

## 2022-07-14 DIAGNOSIS — E282 Polycystic ovarian syndrome: Secondary | ICD-10-CM | POA: Diagnosis not present

## 2022-07-14 DIAGNOSIS — M25569 Pain in unspecified knee: Secondary | ICD-10-CM | POA: Diagnosis not present

## 2022-07-14 DIAGNOSIS — K5903 Drug induced constipation: Secondary | ICD-10-CM | POA: Diagnosis not present

## 2022-07-18 LAB — CYTOLOGY - PAP
Comment: NEGATIVE
Diagnosis: NEGATIVE
High risk HPV: NEGATIVE

## 2022-08-07 DIAGNOSIS — G4733 Obstructive sleep apnea (adult) (pediatric): Secondary | ICD-10-CM | POA: Diagnosis not present

## 2022-08-07 DIAGNOSIS — R400 Somnolence: Secondary | ICD-10-CM | POA: Diagnosis not present

## 2022-08-07 DIAGNOSIS — F341 Dysthymic disorder: Secondary | ICD-10-CM | POA: Diagnosis not present

## 2022-08-25 DIAGNOSIS — Z5181 Encounter for therapeutic drug level monitoring: Secondary | ICD-10-CM | POA: Diagnosis not present

## 2022-08-25 DIAGNOSIS — F900 Attention-deficit hyperactivity disorder, predominantly inattentive type: Secondary | ICD-10-CM | POA: Diagnosis not present

## 2022-08-25 DIAGNOSIS — F4323 Adjustment disorder with mixed anxiety and depressed mood: Secondary | ICD-10-CM | POA: Diagnosis not present

## 2022-08-25 DIAGNOSIS — F3112 Bipolar disorder, current episode manic without psychotic features, moderate: Secondary | ICD-10-CM | POA: Diagnosis not present

## 2022-08-30 ENCOUNTER — Ambulatory Visit: Payer: PPO | Admitting: Internal Medicine

## 2022-08-30 ENCOUNTER — Ambulatory Visit: Payer: PPO | Admitting: Student

## 2022-09-08 DIAGNOSIS — M797 Fibromyalgia: Secondary | ICD-10-CM | POA: Diagnosis not present

## 2022-09-08 DIAGNOSIS — K5903 Drug induced constipation: Secondary | ICD-10-CM | POA: Diagnosis not present

## 2022-09-08 DIAGNOSIS — G43109 Migraine with aura, not intractable, without status migrainosus: Secondary | ICD-10-CM | POA: Diagnosis not present

## 2022-09-08 DIAGNOSIS — Z79891 Long term (current) use of opiate analgesic: Secondary | ICD-10-CM | POA: Diagnosis not present

## 2022-09-08 DIAGNOSIS — E282 Polycystic ovarian syndrome: Secondary | ICD-10-CM | POA: Diagnosis not present

## 2022-09-08 DIAGNOSIS — M2550 Pain in unspecified joint: Secondary | ICD-10-CM | POA: Diagnosis not present

## 2022-09-08 DIAGNOSIS — G894 Chronic pain syndrome: Secondary | ICD-10-CM | POA: Diagnosis not present

## 2022-09-08 DIAGNOSIS — M25569 Pain in unspecified knee: Secondary | ICD-10-CM | POA: Diagnosis not present

## 2022-09-30 NOTE — Progress Notes (Deleted)
  Cardiology Office Note:  .   Date:  09/30/2022  ID:  Krystal French, DOB 1975/05/15, MRN [id number] PCP: Delma Officer, PA  [facility] Providers Cardiologist:  Rollene Rotunda, MD   History of Present Illness: .   Krystal French is a 47 y.o. female with a past medical history of palpitations, anxiety, bipolar disorder, fibromyalgia, OSA on CPAP. Patient is followed by Dr. Antoine Poche and Dr. Ladona Ridgel and presents today for an overdo follow up appointment.   Per chart review, patient was first seen by Dr. Antoine Poche in 10/2021 for evaluation of palpitations. Patient wore a cardiac monitor in 10/2021 that showed normal sinus rhythm with frequent runs of SVT. She was referred to EP and seen by Dr. Ladona Ridgel in 10/2021. She was started on toprol. Per EP notes, catheter ablation would be indicated. Patient was lost to follow up after that appointment   SVT  - Patient wore a cardiac monitor in 10/2021 that showed frequent SVT. She was referred to EP and started on metoprolol succinate 25 mg daily  -   Obesity   ROS: ***  Studies Reviewed: .        *** Risk Assessment/Calculations:   {Does this patient have ATRIAL FIBRILLATION?:[id number]} No BP recorded.  {Refresh Note OR Click here to enter BP  :1}***       Physical Exam:   VS:  There were no vitals taken for this visit.   Wt Readings from Last 3 Encounters:  11/18/21 258 lb 12.8 oz (117.4 kg)  11/05/21 (!) 363 lb (164.7 kg)  10/29/21 (!) 365 lb (165.6 kg)    GEN: Well nourished, well developed in no acute distress NECK: No JVD; No carotid bruits CARDIAC: ***RRR, no murmurs, rubs, gallops RESPIRATORY:  Clear to auscultation without rales, wheezing or rhonchi  ABDOMEN: Soft, non-tender, non-distended EXTREMITIES:  No edema; No deformity   ASSESSMENT AND PLAN: .   ***    {Are you ordering a CV Procedure (e.g. stress test, cath, DCCV, TEE, etc)?   Press F2        :[id number]}  Dispo: ***  Signed, Jonita Albee, PA-C

## 2022-10-06 ENCOUNTER — Ambulatory Visit: Payer: PPO | Admitting: Cardiology

## 2022-10-10 DIAGNOSIS — F341 Dysthymic disorder: Secondary | ICD-10-CM | POA: Diagnosis not present

## 2022-10-10 DIAGNOSIS — R400 Somnolence: Secondary | ICD-10-CM | POA: Diagnosis not present

## 2022-10-10 DIAGNOSIS — G4733 Obstructive sleep apnea (adult) (pediatric): Secondary | ICD-10-CM | POA: Diagnosis not present

## 2022-10-29 ENCOUNTER — Other Ambulatory Visit: Payer: Self-pay | Admitting: Internal Medicine

## 2022-11-01 ENCOUNTER — Ambulatory Visit: Payer: PPO | Admitting: Internal Medicine

## 2022-11-03 DIAGNOSIS — M797 Fibromyalgia: Secondary | ICD-10-CM | POA: Diagnosis not present

## 2022-11-03 DIAGNOSIS — K5903 Drug induced constipation: Secondary | ICD-10-CM | POA: Diagnosis not present

## 2022-11-03 DIAGNOSIS — M2550 Pain in unspecified joint: Secondary | ICD-10-CM | POA: Diagnosis not present

## 2022-11-03 DIAGNOSIS — M25569 Pain in unspecified knee: Secondary | ICD-10-CM | POA: Diagnosis not present

## 2022-11-03 DIAGNOSIS — E282 Polycystic ovarian syndrome: Secondary | ICD-10-CM | POA: Diagnosis not present

## 2022-11-03 DIAGNOSIS — Z79891 Long term (current) use of opiate analgesic: Secondary | ICD-10-CM | POA: Diagnosis not present

## 2022-11-03 DIAGNOSIS — G43109 Migraine with aura, not intractable, without status migrainosus: Secondary | ICD-10-CM | POA: Diagnosis not present

## 2022-11-03 DIAGNOSIS — G894 Chronic pain syndrome: Secondary | ICD-10-CM | POA: Diagnosis not present

## 2022-11-22 ENCOUNTER — Telehealth: Payer: Self-pay | Admitting: *Deleted

## 2022-11-22 NOTE — Telephone Encounter (Signed)
Already has in-office appt scheduled. I will remove from preop box.

## 2022-11-22 NOTE — Telephone Encounter (Signed)
Pre-operative Risk Assessment    Patient Name: Krystal French  DOB: 02/17/1976 MRN: [id number]   LAST O/V:  10/2021 NEXT OV:  12/05/22  Request for Surgical Clearance    Procedure:   COLONOSCOPY  Date of Surgery:  Clearance TBD                                 Surgeon:  DR. Kerin Salen Surgeon's Group or Practice Name:  [facility] Phone number:  [phone] Fax number:  [phone]   Type of Clearance Requested:   - Medical    Type of Anesthesia:   PROPOFOL   Additional requests/questions:    Wilhemina Cash   11/22/2022, 2:41 PM

## 2022-11-25 NOTE — Telephone Encounter (Signed)
Pt is scheduled to see Hulan Saas, NP, 12/05/22, clearance will be addressed at that time.  Will route to [facility] to make them aware.

## 2022-11-25 NOTE — Progress Notes (Signed)
Cardiology Office Note:  .   Date:  12/05/2022  ID:  Krystal French, DOB 1975/06/27, MRN [id number] PCP: Delma Officer, PA  [facility] Providers Cardiologist:  Rollene Rotunda, MD   History of Present Illness: .   Krystal French is a 47 y.o. female with a past medical history of SVT, obesity, chronic pain syndrome, migraines, fibromyalgia. Patient is followed by Dr. Antoine Poche and presents today for an annual follow up appointment and for preoperative evaluation.   Per chart review, patient was referred to Dr. Antoine Poche in 10/2021 for evaluation of palpitations. SVT was suspected. Electrolytes and TSH were within normal limits. She wore a cardiac monitor in 10/2021 that showed frequent runs of SVT. She was evaluated by Dr. Ladona Ridgel and started on BB. Does no appear that she has been seen by cardiology since that time.   On interview today, patient reports that she has been doing very well from a cardiac perspective since last being seen. In the past year, she has only had 2-3 short episodes of SVT. Otherwise, her HR has been well controlled on metoprolol. She is compliant with her medications. Denies palpitations. Denies chest pain, shortness of breath, ankle edema. She is somewhat limited in her physical activities due to her chronic pain from fibromyalgia. However, she is able to complete 7.8 METs according to [facility]AS[facility].   ROS: Denies chest pain, syncope, near syncope, dizziness, ankle edema, orthopnea, shortness of breath.   Studies Reviewed: Marland Kitchen   EKG Interpretation Date/Time:  Monday December 05 2022 16:08:35 EDT Ventricular Rate:  95 PR Interval:  162 QRS Duration:  92 QT Interval:  358 QTC Calculation: 449 R Axis:   0  Text Interpretation: Normal sinus rhythm Low voltage QRS When compared with ECG of 11-Oct-2021 02:26, No significant change was found Confirmed by Robet Leu [id number]) on 12/05/2022 8:56:21 PMCardiac Studies & Procedures         MONITORS  CARDIAC EVENT MONITOR  11/16/2021  Narrative Normal sinus rhythm Frequent runs of SVT           Risk Assessment/Calculations:     HYPERTENSION CONTROL Vitals:   12/05/22 1515 12/05/22 2050  BP: (!) 128/90 (!) 126/92    The patient's blood pressure is elevated above target today.  In order to address the patient's elevated BP: A current anti-hypertensive medication was adjusted today.          Physical Exam:   VS:  BP (!) 126/92   Pulse (!) 107   Ht 5' 10.5" (1.791 m)   Wt (!) 351 lb 6.4 oz (159.4 kg)   SpO2 97%   BMI 49.71 kg/m    Wt Readings from Last 3 Encounters:  12/05/22 (!) 351 lb 6.4 oz (159.4 kg)  11/18/21 258 lb 12.8 oz (117.4 kg)  11/05/21 (!) 363 lb (164.7 kg)    GEN: Well nourished, well developed in no acute distress. Sitting comfortably on the exam table  NECK: No JVD; No carotid bruits CARDIAC: RRR, no murmurs, rubs, gallops. Radial pulses 2+ bilaterally  RESPIRATORY:  Clear to auscultation without rales, wheezing or rhonchi. Normal work of breathing on room air  ABDOMEN: Soft, non-tender, non-distended EXTREMITIES:  No edema in BLE ; No deformity   ASSESSMENT AND PLAN: .    Preoperative evaluation  - Patient is pending colonoscopy  - Patient denies chest pain, shortness of breath, DOE, orthopnea, syncope, near syncope, dizziness. She has only had 2 or 3 episodes of SVT in the past year  -  EKG today with NSR, HR 95 BPM. No ischemic changes  - Duke Activity Status index- functional capacity in METS is 7.77. No further cardiac workup indicated prior to colonoscopy  - Patient is not on blood thinners. No need to hold any cardiac medications prior to procedure   SVT  - Noted on cardiac monitor in 10/2021  - Reports that SVT has been very well controlled on Metoprolol, and she has only had 2 or 3 episodes of SVT in the past year  - BP has been a bit elevated- increased metoprolol to 50 mg daily for BP control   Elevated BP  - SBP in the 120s, but DBP in the 90s. Patient  reports that her DBP has been a bit high recently  - Increased metoprolol succinate to 50 mg daily    Dispo: Follow up in 1 year   Signed, Jonita Albee, PA-C

## 2022-12-05 ENCOUNTER — Ambulatory Visit: Payer: PPO | Attending: Nurse Practitioner | Admitting: Cardiology

## 2022-12-05 ENCOUNTER — Encounter: Payer: Self-pay | Admitting: Cardiology

## 2022-12-05 VITALS — BP 126/92 | HR 107 | Ht 70.5 in | Wt 351.4 lb

## 2022-12-05 DIAGNOSIS — I471 Supraventricular tachycardia, unspecified: Secondary | ICD-10-CM | POA: Diagnosis not present

## 2022-12-05 DIAGNOSIS — R030 Elevated blood-pressure reading, without diagnosis of hypertension: Secondary | ICD-10-CM

## 2022-12-05 DIAGNOSIS — Z01810 Encounter for preprocedural cardiovascular examination: Secondary | ICD-10-CM

## 2022-12-05 MED ORDER — METOPROLOL SUCCINATE ER 50 MG PO TB24: 50.0000 mg | ORAL_TABLET | Freq: Every day | ORAL | 3 refills | Status: DC

## 2022-12-05 NOTE — Patient Instructions (Signed)
Medication Instructions:  Increase Metoprolol Succinate to 50 mg once a day *If you need a refill on your cardiac medications before your next appointment, please call your pharmacy*   Lab Work: No Labs   Testing/Procedures: No Testing   Follow-Up: At [facility], you and your health needs are our priority.  As part of our continuing mission to provide you with exceptional heart care, we have created designated Provider Care Teams.  These Care Teams include your primary Cardiologist (physician) and Advanced Practice Providers (APPs -  Physician Assistants and Nurse Practitioners) who all work together to provide you with the care you need, when you need it.  We recommend signing up for the patient portal called "MyChart".  Sign up information is provided on this After Visit Summary.  MyChart is used to connect with patients for Virtual Visits (Telemedicine).  Patients are able to view lab/test results, encounter notes, upcoming appointments, etc.  Non-urgent messages can be sent to your provider as well.   To learn more about what you can do with MyChart, go to [email or web address].    Your next appointment:   1 year(s)  Provider:   Rollene Rotunda, MD

## 2022-12-20 DIAGNOSIS — F3131 Bipolar disorder, current episode depressed, mild: Secondary | ICD-10-CM | POA: Diagnosis not present

## 2022-12-20 DIAGNOSIS — F900 Attention-deficit hyperactivity disorder, predominantly inattentive type: Secondary | ICD-10-CM | POA: Diagnosis not present

## 2022-12-29 DIAGNOSIS — M2550 Pain in unspecified joint: Secondary | ICD-10-CM | POA: Diagnosis not present

## 2022-12-29 DIAGNOSIS — M25569 Pain in unspecified knee: Secondary | ICD-10-CM | POA: Diagnosis not present

## 2022-12-29 DIAGNOSIS — G43109 Migraine with aura, not intractable, without status migrainosus: Secondary | ICD-10-CM | POA: Diagnosis not present

## 2022-12-29 DIAGNOSIS — M5451 Vertebrogenic low back pain: Secondary | ICD-10-CM | POA: Diagnosis not present

## 2022-12-29 DIAGNOSIS — Z79891 Long term (current) use of opiate analgesic: Secondary | ICD-10-CM | POA: Diagnosis not present

## 2022-12-29 DIAGNOSIS — K5903 Drug induced constipation: Secondary | ICD-10-CM | POA: Diagnosis not present

## 2022-12-29 DIAGNOSIS — E282 Polycystic ovarian syndrome: Secondary | ICD-10-CM | POA: Diagnosis not present

## 2022-12-29 DIAGNOSIS — M797 Fibromyalgia: Secondary | ICD-10-CM | POA: Diagnosis not present

## 2022-12-29 DIAGNOSIS — G894 Chronic pain syndrome: Secondary | ICD-10-CM | POA: Diagnosis not present

## 2023-01-11 DIAGNOSIS — F341 Dysthymic disorder: Secondary | ICD-10-CM | POA: Diagnosis not present

## 2023-01-11 DIAGNOSIS — G4733 Obstructive sleep apnea (adult) (pediatric): Secondary | ICD-10-CM | POA: Diagnosis not present

## 2023-01-11 DIAGNOSIS — R400 Somnolence: Secondary | ICD-10-CM | POA: Diagnosis not present

## 2023-01-12 DIAGNOSIS — E038 Other specified hypothyroidism: Secondary | ICD-10-CM | POA: Diagnosis not present

## 2023-01-12 DIAGNOSIS — E282 Polycystic ovarian syndrome: Secondary | ICD-10-CM | POA: Diagnosis not present

## 2023-01-12 DIAGNOSIS — R030 Elevated blood-pressure reading, without diagnosis of hypertension: Secondary | ICD-10-CM | POA: Diagnosis not present

## 2023-01-12 DIAGNOSIS — F319 Bipolar disorder, unspecified: Secondary | ICD-10-CM | POA: Diagnosis not present

## 2023-01-12 DIAGNOSIS — R7303 Prediabetes: Secondary | ICD-10-CM | POA: Diagnosis not present

## 2023-01-12 DIAGNOSIS — E782 Mixed hyperlipidemia: Secondary | ICD-10-CM | POA: Diagnosis not present

## 2023-01-12 DIAGNOSIS — Z0000 Encounter for general adult medical examination without abnormal findings: Secondary | ICD-10-CM | POA: Diagnosis not present

## 2023-01-12 DIAGNOSIS — G894 Chronic pain syndrome: Secondary | ICD-10-CM | POA: Diagnosis not present

## 2023-01-12 DIAGNOSIS — M797 Fibromyalgia: Secondary | ICD-10-CM | POA: Diagnosis not present

## 2023-01-12 DIAGNOSIS — I471 Supraventricular tachycardia, unspecified: Secondary | ICD-10-CM | POA: Diagnosis not present

## 2023-01-12 DIAGNOSIS — Z23 Encounter for immunization: Secondary | ICD-10-CM | POA: Diagnosis not present

## 2023-01-25 ENCOUNTER — Ambulatory Visit: Payer: PPO | Admitting: Internal Medicine

## 2023-01-30 DIAGNOSIS — H61002 Unspecified perichondritis of left external ear: Secondary | ICD-10-CM | POA: Diagnosis not present

## 2023-03-02 DIAGNOSIS — M797 Fibromyalgia: Secondary | ICD-10-CM | POA: Diagnosis not present

## 2023-03-02 DIAGNOSIS — K5903 Drug induced constipation: Secondary | ICD-10-CM | POA: Diagnosis not present

## 2023-03-02 DIAGNOSIS — M5451 Vertebrogenic low back pain: Secondary | ICD-10-CM | POA: Diagnosis not present

## 2023-03-02 DIAGNOSIS — E282 Polycystic ovarian syndrome: Secondary | ICD-10-CM | POA: Diagnosis not present

## 2023-03-02 DIAGNOSIS — M25569 Pain in unspecified knee: Secondary | ICD-10-CM | POA: Diagnosis not present

## 2023-03-02 DIAGNOSIS — G43109 Migraine with aura, not intractable, without status migrainosus: Secondary | ICD-10-CM | POA: Diagnosis not present

## 2023-03-02 DIAGNOSIS — M2550 Pain in unspecified joint: Secondary | ICD-10-CM | POA: Diagnosis not present

## 2023-03-02 DIAGNOSIS — G894 Chronic pain syndrome: Secondary | ICD-10-CM | POA: Diagnosis not present

## 2023-04-07 ENCOUNTER — Telehealth: Payer: Self-pay | Admitting: *Deleted

## 2023-04-07 ENCOUNTER — Other Ambulatory Visit: Payer: Self-pay | Admitting: Gastroenterology

## 2023-04-07 NOTE — Telephone Encounter (Signed)
   Pre-operative Risk Assessment    Patient Name: Krystal French  DOB: Sep 06, 1975 MRN: [id number]   Date of last office visit: 12/05/2022 Date of next office visit: 05/16/2023   Request for Surgical Clearance    Procedure:   COLONOSCOPY  Date of Surgery:  Clearance TBD                                Surgeon:  DR. ESTELITA MANAS Surgeon's Group or Practice Name:  [facility] Phone number:  [phone] Fax number:  [phone]   Type of Clearance Requested:   - Medical    Type of Anesthesia:   PROPOFOL    Additional requests/questions:    Bonney [facility]   04/07/2023, 2:14 PM \

## 2023-04-07 NOTE — Telephone Encounter (Signed)
   Name: Krystal French  DOB: 1975-03-27  MRN: [id number]  Primary Cardiologist: Lynwood Schilling, MD   Preoperative team, please contact this patient and set up a phone call appointment for further preoperative risk assessment. Please obtain consent and complete medication review. Thank you for your help.  I confirm that guidance regarding antiplatelet and oral anticoagulation therapy has been completed and, if necessary, noted below.  None requested.  I also confirmed the patient resides in the state of [place] . As per [facility] Medical Board telemedicine laws, the patient must reside in the state in which the provider is licensed.   [name], NP 04/07/2023, 3:25 PM [facility]

## 2023-04-11 ENCOUNTER — Telehealth: Payer: Self-pay

## 2023-04-11 NOTE — Telephone Encounter (Signed)
Patient has been scheduled for telephone visit med rec and consent done.     Patient Consent for Virtual Visit         Krystal French has provided verbal consent on 04/11/2023 for a virtual visit (video or telephone).   CONSENT FOR VIRTUAL VISIT FOR:  Krystal French  By participating in this virtual visit I agree to the following:  I hereby voluntarily request, consent and authorize [facility] and its employed or contracted physicians, physician assistants, nurse practitioners or other licensed health care professionals (the Practitioner), to provide me with telemedicine health care services (the "Services") as deemed necessary by the treating Practitioner. I acknowledge and consent to receive the Services by the Practitioner via telemedicine. I understand that the telemedicine visit will involve communicating with the Practitioner through live audiovisual communication technology and the disclosure of certain medical information by electronic transmission. I acknowledge that I have been given the opportunity to request an in-person assessment or other available alternative prior to the telemedicine visit and am voluntarily participating in the telemedicine visit.  I understand that I have the right to withhold or withdraw my consent to the use of telemedicine in the course of my care at any time, without affecting my right to future care or treatment, and that the Practitioner or I may terminate the telemedicine visit at any time. I understand that I have the right to inspect all information obtained and/or recorded in the course of the telemedicine visit and may receive copies of available information for a reasonable fee.  I understand that some of the potential risks of receiving the Services via telemedicine include:  Delay or interruption in medical evaluation due to technological equipment failure or disruption; Information transmitted may not be sufficient (e.g. poor resolution of  images) to allow for appropriate medical decision making by the Practitioner; and/or  In rare instances, security protocols could fail, causing a breach of personal health information.  Furthermore, I acknowledge that it is my responsibility to provide information about my medical history, conditions and care that is complete and accurate to the best of my ability. I acknowledge that Practitioner's advice, recommendations, and/or decision may be based on factors not within their control, such as incomplete or inaccurate data provided by me or distortions of diagnostic images or specimens that may result from electronic transmissions. I understand that the practice of medicine is not an exact science and that Practitioner makes no warranties or guarantees regarding treatment outcomes. I acknowledge that a copy of this consent can be made available to me via my patient portal [facility] MyChart), or I can request a printed copy by calling the office of [facility].    I understand that my insurance will be billed for this visit.   I have read or had this consent read to me. I understand the contents of this consent, which adequately explains the benefits and risks of the Services being provided via telemedicine.  I have been provided ample opportunity to ask questions regarding this consent and the Services and have had my questions answered to my satisfaction. I give my informed consent for the services to be provided through the use of telemedicine in my medical care

## 2023-04-11 NOTE — Telephone Encounter (Signed)
Patient has been scheduled for telephone visit.

## 2023-04-24 NOTE — Progress Notes (Unsigned)
 Virtual Visit via Telephone Note   Because of Krystal French co-morbid illnesses, she is at least at moderate risk for complications without adequate follow up.  This format is felt to be most appropriate for this patient at this time.  Due to technical limitations with video connection (technology), today's appointment will be conducted as an audio only telehealth visit, and Krystal French verbally agreed to proceed in this manner.   All issues noted in this document were discussed and addressed.  No physical exam could be performed with this format.  Evaluation Performed:  Preoperative cardiovascular risk assessment _____________   Date:  04/24/2023   Patient ID:  Krystal French, DOB 27-May-1975, MRN [id number] Patient Location:  Home Provider location:   Office  Primary Care Provider:  Delma Officer, PA Primary Cardiologist:  Rollene Rotunda, MD  Chief Complaint / Patient Profile   48 y.o. y/o female with a h/o SVT, obesity, chronic pain syndrome, migraines, fibromyalgia who is pending colonoscopy and presents today for telephonic preoperative cardiovascular risk assessment.  History of Present Illness    Krystal French is a 48 y.o. female who presents via audio/video conferencing for a telehealth visit today.  Pt was last seen in cardiology clinic on 12/05/2022 by Robet Leu, PA.  At that time Krystal French was doing well and noted 2-3 short episodes of SVT with otherwise well-controlled heart rate.  The patient is now pending procedure as outlined above. Since her last visit, she has been doing well with no new cardiac complaints.  She reports since her Toprol-XL was adjusted that her episodes of SVT has been under better control.  She also reports that her heart rate has been in the 80s and blood pressures have been stable.   She denies chest pain, shortness of breath, lower extremity edema, fatigue, palpitations, melena,  hematuria, hemoptysis, diaphoresis, weakness, presyncope, syncope, orthopnea, and PND.    Past Medical History    Past Medical History:  Diagnosis Date   Anxiety    Bipolar 2 disorder (HCC)    Fibromyalgia    Migraine    Morbid obesity (HCC)    Neuropathy    Polycystic ovaries    Sleep apnea    CPAP   Past Surgical History:  Procedure Laterality Date   CHOLECYSTECTOMY     knee arthroscopic     WISDOM TOOTH EXTRACTION      Allergies  Allergies  Allergen Reactions   Codeine    Morphine And Codeine    Latex     Makes patient skin peal off   Sulfa Antibiotics     coma    Home Medications    Prior to Admission medications   Medication Sig Start Date End Date Taking? Authorizing Provider  ARIPiprazole (ABILIFY) 10 MG tablet Take 10 mg by mouth daily at 2 PM. 09/18/21   [provider]  Armodafinil 200 MG TABS Take 1 tablet by mouth daily. Patient states 250 MG daily 04/22/19   [provider]  BELBUCA 750 MCG FILM Take 750 mcg by mouth 2 (two) times daily. 09/13/21   [provider]  clonazePAM (KLONOPIN) 1 MG tablet 3 (three) times daily as needed. 06/03/19   [provider]  cyclobenzaprine (FLEXERIL) 10 MG tablet  09/13/21   [provider]  fish oil-omega-3 fatty acids 1000 MG capsule Take 1 g by mouth daily.    [provider]  fluticasone (FLONASE) 50 MCG/ACT nasal spray fluticasone propionate 50 mcg/actuation nasal spray,suspension  [provider]  folic acid (FOLVITE) 1 MG tablet Take 1 mg by mouth daily.    [provider]  HYDROmorphone (DILAUDID) 4 MG tablet Take 4 mg by mouth at bedtime. 06/03/19   [provider]  LARIN FE 1.5/30 1.5-30 MG-MCG tablet Take 1 tablet by mouth daily. 06/03/19   [provider]  metFORMIN (GLUCOPHAGE) 500 MG tablet Take 500 mg by mouth daily. 06/03/19   [provider]  Methylphenidate HCl ER, OSM, 72 MG TBCR  04/19/21   [provider]  metoprolol succinate (TOPROL-XL) 50 MG 24 hr tablet Take 1 tablet (50 mg total) by mouth daily. Take with or immediately following a meal. 12/05/22 03/05/23  Jonita Albee, PA-C  NARCAN 4 MG/0.1ML LIQD nasal spray kit SMARTSIG:1 Spray(s) Both Nares Once PRN 01/28/19   [provider]  ondansetron (ZOFRAN) 4 MG tablet Take 4 mg by mouth every 8 (eight) hours as needed for nausea.    [provider]  prazosin (MINIPRESS) 2 MG capsule  09/18/21   [provider]  Ubrogepant (UBRELVY) 100 MG TABS     [provider]  zaleplon (SONATA) 10 MG capsule Take 10 mg by mouth at bedtime as needed. 06/20/19   [provider]  zonisamide (ZONEGRAN) 100 MG capsule Take 200 mg by mouth at bedtime.  06/26/19   [provider]    Physical Exam    Vital Signs:  Krystal French does not have vital signs available for review today.  Given telephonic nature of communication, physical exam is limited. AAOx3. NAD. Normal affect.  Speech and respirations are unlabored.  Accessory Clinical Findings    None  Assessment & Plan    1.  Preoperative Cardiovascular Risk Assessment: -Patient's RCRI score is 0.4%  The patient affirms she has been doing well without any new cardiac symptoms. They are able to achieve 7 METS without cardiac limitations. Therefore, based on ACC/AHA guidelines, the patient would be at acceptable risk for the planned procedure without further cardiovascular testing. The patient was advised that if she develops new symptoms prior to surgery to contact our office to arrange for a follow-up visit, and she verbalized understanding.   The patient was advised that if she develops new symptoms prior to surgery to contact our office to arrange for a follow-up visit, and she verbalized understanding.   A copy of this note will be routed to requesting surgeon.  Time:   Today, I have spent 8 minutes with the patient with telehealth technology  discussing medical history, symptoms, and management plan.     Napoleon Form, Leodis Rains, NP  04/24/2023, 7:48 AM

## 2023-04-25 ENCOUNTER — Ambulatory Visit: Payer: PPO | Attending: Cardiology

## 2023-04-25 DIAGNOSIS — Z01810 Encounter for preprocedural cardiovascular examination: Secondary | ICD-10-CM | POA: Diagnosis not present

## 2023-05-16 ENCOUNTER — Ambulatory Visit: Payer: PPO | Admitting: Internal Medicine

## 2023-05-16 ENCOUNTER — Encounter (HOSPITAL_COMMUNITY): Payer: Self-pay | Admitting: Gastroenterology

## 2023-05-22 NOTE — Anesthesia Preprocedure Evaluation (Signed)
 Anesthesia Evaluation  Patient identified by MRN, date of birth, ID band Patient awake    Reviewed: Allergy & Precautions, NPO status , Patient's Chart, lab work & pertinent test results, reviewed documented beta blocker date and time   History of Anesthesia Complications Negative for: history of anesthetic complications  Airway Mallampati: III  TM Distance: >3 FB Neck ROM: Full    Dental  (+) Dental Advisory Given, Chipped   Pulmonary sleep apnea and Continuous Positive Airway Pressure Ventilation , former smoker   Pulmonary exam normal        Cardiovascular Normal cardiovascular exam+ dysrhythmias Supra Ventricular Tachycardia      Neuro/Psych  Headaches PSYCHIATRIC DISORDERS Anxiety  Bipolar Disorder      GI/Hepatic negative GI ROS, Neg liver ROS,,,  Endo/Other    Class 4 obesity  Renal/GU negative Renal ROS     Musculoskeletal  (+)  Fibromyalgia -  Abdominal  (+) + obese  Peds  Hematology negative hematology ROS (+)   Anesthesia Other Findings On belbuca   Reproductive/Obstetrics  PCOS                              Anesthesia Physical Anesthesia Plan  ASA: 3  Anesthesia Plan: MAC   Post-op Pain Management: Minimal or no pain anticipated   Induction:   PONV Risk Score and Plan: 2 and Propofol infusion and Treatment may vary due to age or medical condition  Airway Management Planned: Natural Airway and Simple Face Mask  Additional Equipment: None  Intra-op Plan:   Post-operative Plan:   Informed Consent: I have reviewed the patients History and Physical, chart, labs and discussed the procedure including the risks, benefits and alternatives for the proposed anesthesia with the patient or authorized representative who has indicated his/her understanding and acceptance.       Plan Discussed with: CRNA and Anesthesiologist  Anesthesia Plan Comments:          Anesthesia Quick Evaluation

## 2023-05-22 NOTE — H&P (Signed)
 History of Present Illness  This is a 48 year old patient, here today to set up a colonoscopy. Medical history includes PCOS, bipolar disorder, migraines, morbid obesity, GERD, obstructive sleep apnea, chronic pain (followed by pain management clinic).  Alarm Symptoms: Anemia: Denies Unintentional Weight Loss: No, patient has lost 80 lbs since starting metformin and sprintec for PCOS.  Change in Appetite: Denies Change in Bowel Habits: Denies Trouble Swallowing: Denies Blood in Stool: Denies Black Stools: Denies  Abdominal Pain: Denies Diarrhea/Constipation: Constipation from pain medications N/V: Denies Reflux: Denies Belching or abdominal bloating: Denies NSAID Use: Denies Blood Thinners: Denies Major medical events over past 6 months: Denies Cardiologist: Sharrell Ku, MD  Family History of Colon Cancer: Denies.  Current Medications Abilify 10 MG Tablet 1 tablet Orally Once a day Belbuca(Buprenorphine HCl) 750 MCG Film 1 film to the gum Bucally every 12 hrs CPAP Use Nightly Cyclobenzaprine HCl 10 MG Tablet 1 tablet at bedtime as needed Orally Once a day Fish Oil 1000 MG Capsule 1 capsule Orally Once a day Fluticasone Propionate 50 MCG/ACT Suspension 2 sprays in each nostril Nasally as needed Glucos-Chondroit-MSM Complex(Misc Natural Products) - Tablet as directed Orally HYDROmorphone HCl 4 MG Tablet 1-2 tablet as needed Orally once a day KlonoPIN 1 MG Tablet 1 tablet Orally three times a day as needed metFORMIN HCl 500 MG Tablet TAKE ONE TABLET BY MOUTH EVERY DAY WITH MEALS orally once a day Methylphenidate HCl ER 70 MG Capsule Extended Release 24 Hour 1 capsule in the morning Orally Once a day , Notes to Pharmacist: 72 mg Nortriptyline HCl 75 MG Capsule 1 capsule Orally Once a day Ondansetron HCl 4 MG Tablet 2 tablets Orally Once a day , Notes to Pharmacist: as needed Prazosin HCl 5 MG Capsule 1 capsule at bedtime Orally Once a day Probiotic Capsule Orally Sprintec  28(Norgestimate-Eth Estradiol) 0.25-35 MG-MCG Tablet 1 tablet Orally Once a day Vitamin B Complex-C(B Complex-C) - Capsule as directed Orally Zaleplon 10 MG Capsule 1 capsule at bedtime as needed Orally Once a day Zonisamide 100 MG Capsule 1 capsule Orally Once a day   Past Medical History bipolar [facility], Dr. Betti Cruz. Polycystic ovary syndrome. Abnormal pap with cryo at age 41. Migraines. Insomnia. Morbid obesity. Esophageal reflux- better after gallbladder removal. sleep apnea on CPAP . Hyperlipidemia. kidney stones- [facility] . chronic pain seen at pain management clinic [facility] in [place]. : Obstructive sleep apnea -first diagnosis ? 2013 -started CPAP-(PSG 11/20/15 ESS 7, AHI 5/hr REM 13/hr, RDI 6/hr REM13/hr, O2 min 88%. [facility].  Surgical History wisdom teeth extraction 1991 cholecystectomy 04/1999 ACL repair right 2015 skin bx 2013  Family History Father: alive 88 yrs, hypercholesterolemia, hypertension, fibromyalgia and peripheral neuropathy, OSA, Dementia, polyps, diagnosed with Hypertension, Diabetes Mother: alive 22 yrs, hypercholesterolemia, fibromyalgia (hysterectomy) Paternal Grand Father: deceased, hypertension, MI, diagnosed with Hypertension Paternal Grand Mother: deceased, leukemia, worked for the [employer] project age 30 Maternal Grand Father: deceased, hypertension, MI(50s), bipolar, diagnosed with Hypertension, Coronary artery disease Maternal Grand Mother: alive 46 yrs, uterine cancer survivor (hysterectomy), ischemic CVA, diagnosed with CVA Brother 1: alive, ? sleep apnea, healthy, gastric bypass, OSA Sister 1: alive, healthy, gastric bypass, OSA 1 brother(s) , 1 sister(s) . RA in [place] maternal aunt with schizoaffective disorder no children All women in her family have PCOS No Family History of Colon Cancer,  Liver Disease .  Social History Tobacco use  cigarettes:  Never smoked, Tobacco history last updated   11/22/2022,  Vaping  No. EXPOSURE TO PASSIVE SMOKE: no. Alcohol: no. Caffeine: yes, 1 serving daily, coffee. Recreational drug use: no, no. Exercise: NONE. Marital Status: Married, 2006 John. Children: none, does not want any. EDUCATION: Some College. OCCUPATION: pt is not working at present, on disability. Religion: Methodist, looking for a church. Seat belt use: yes. Tobacco Exposure: Father mild smoker. Gyn History Sexual activity not currently sexually active, with husband.  Periods : every month, regular.  LMP ~end of 05/2022.  Birth control Sprintec.  Last pap smear date 05/2021 NILM, HRHPV neg.  Denies H/O Last mammogram date 10 years ago per patient.  H/O Abnormal pap smear 04/09/2014 - LGSIL, HPV+.  H/O STD HPV.  Menarche 12.  Other: dx PCOS age 20.  OB History Number of pregnancies  1.  Pregnancy # 1  miscarriage age 80.   Allergies Morphine Sulfate: itching - Side Effects Latex Gloves: rash - Allergy Toradol Injection: itching general anesthetics: shakiness Tramadol HCl: suicidal ideation - Side Effects soy: headaches and itchiness - Allergy Sulfa Antibiotics: anaphylaxis - Allergy Codeine: nausea - Side Effects Vicodin: migraines - Side Effects  Vital Signs Wt: 347.2, Wt change: -3 lbs, Ht: 68, BMI: 52.79, Temp: 98.6, Pulse sitting: 88, BP sitting: 138/89. Examination GENERAL APPEARANCE:  Well developed, well nourished, no active distress, pleasant, no acute distress .  ABDOMEN  Bowel sounds normal, Abdomen not distended, Non-tender to palpation.  PSYCHIATRIC  Alert and oriented x3, mood and affect appear normal.  Assessments 1. Colon cancer screening - Z12.11 (Primary)    2. Constipation due to opioid therapy - K59.09    3. Morbid obesity with body mass index of 50 or higher - E66.01    4. Ventricular fibrillation - I49.01     Treatment 1. Colon cancer screening   Colonoscopy (Ordered for 11/22/2022)    Notes: No alarm symptoms reported today. BMI 52. Discussed  risk of colonoscopy including perforation, bleeding, infection. All questions regarding procedure answered today. Order placed for colonoscopy to be completed at [facility] due to BMI. Cardiac clearance sent to Dr. Sharrell Ku, cardiologist due to history of ventricular fibrillation.   2. Constipation due to opioid therapy     Notes: Advised patient to take high-fiber diet, increase intake of whole grains, fruits, vegetables, increase intake of water and drink at least half of body weight in pounds as ounces a day. Discussed starting fiber supplement benefiber daily.    3. Morbid obesity with body mass index of 50 or higher    Notes: No alarm symptoms reported today. BMI 52. Discussed risk of colonoscopy including perforation, bleeding, infection. All questions regarding procedure answered today. Order placed for colonoscopy to be completed at [facility] due to BMI. Cardiac clearance sent to Dr. Sharrell Ku, cardiologist due to history of ventricular fibrillation.    4. History of Ventricular fibrillation    Cardiac clearance sent to Dr. Sharrell Ku, cardiologist due to history of ventricular fibrillation.

## 2023-05-23 ENCOUNTER — Ambulatory Visit (HOSPITAL_COMMUNITY): Payer: Self-pay | Admitting: Anesthesiology

## 2023-05-23 ENCOUNTER — Other Ambulatory Visit: Payer: Self-pay

## 2023-05-23 ENCOUNTER — Encounter (HOSPITAL_COMMUNITY): Payer: Self-pay | Admitting: Gastroenterology

## 2023-05-23 ENCOUNTER — Encounter (HOSPITAL_COMMUNITY)
Admission: RE | Disposition: A | Discharge status: Home / Self Care | Payer: Self-pay | Source: Home / Self Care | Attending: Gastroenterology

## 2023-05-23 ENCOUNTER — Ambulatory Visit (HOSPITAL_COMMUNITY)
Admission: RE | Admit: 2023-05-23 | Discharge: 2023-05-23 | Disposition: A | Discharge status: Home / Self Care | Payer: PPO | Attending: Gastroenterology | Admitting: Gastroenterology

## 2023-05-23 ENCOUNTER — Ambulatory Visit (HOSPITAL_BASED_OUTPATIENT_CLINIC_OR_DEPARTMENT_OTHER): Payer: Self-pay | Admitting: Anesthesiology

## 2023-05-23 DIAGNOSIS — Z1211 Encounter for screening for malignant neoplasm of colon: Secondary | ICD-10-CM | POA: Insufficient documentation

## 2023-05-23 DIAGNOSIS — K648 Other hemorrhoids: Secondary | ICD-10-CM | POA: Insufficient documentation

## 2023-05-23 DIAGNOSIS — D123 Benign neoplasm of transverse colon: Secondary | ICD-10-CM

## 2023-05-23 DIAGNOSIS — M797 Fibromyalgia: Secondary | ICD-10-CM | POA: Diagnosis not present

## 2023-05-23 DIAGNOSIS — Z6841 Body Mass Index (BMI) 40.0 and over, adult: Secondary | ICD-10-CM | POA: Insufficient documentation

## 2023-05-23 DIAGNOSIS — F319 Bipolar disorder, unspecified: Secondary | ICD-10-CM | POA: Insufficient documentation

## 2023-05-23 DIAGNOSIS — F419 Anxiety disorder, unspecified: Secondary | ICD-10-CM | POA: Insufficient documentation

## 2023-05-23 DIAGNOSIS — R519 Headache, unspecified: Secondary | ICD-10-CM | POA: Diagnosis not present

## 2023-05-23 DIAGNOSIS — E6689 Other obesity not elsewhere classified: Secondary | ICD-10-CM | POA: Insufficient documentation

## 2023-05-23 DIAGNOSIS — I472 Ventricular tachycardia, unspecified: Secondary | ICD-10-CM | POA: Insufficient documentation

## 2023-05-23 DIAGNOSIS — G4730 Sleep apnea, unspecified: Secondary | ICD-10-CM | POA: Diagnosis not present

## 2023-05-23 DIAGNOSIS — Z87891 Personal history of nicotine dependence: Secondary | ICD-10-CM | POA: Diagnosis not present

## 2023-05-23 HISTORY — PX: COLONOSCOPY WITH PROPOFOL: SHX5780

## 2023-05-23 HISTORY — PX: POLYPECTOMY: SHX5525

## 2023-05-23 LAB — PREGNANCY, URINE: Preg Test, Ur: NEGATIVE

## 2023-05-23 SURGERY — COLONOSCOPY WITH PROPOFOL
Anesthesia: Monitor Anesthesia Care

## 2023-05-23 MED ORDER — SODIUM CHLORIDE 0.9% FLUSH: 3.0000 mL | Freq: Two times a day (BID) | INTRAVENOUS | Status: DC

## 2023-05-23 MED ORDER — PROPOFOL 500 MG/50ML IV EMUL
INTRAVENOUS | Status: DC | PRN
  Administered 2023-05-23: 100 ug/kg/min via INTRAVENOUS

## 2023-05-23 MED ORDER — LIDOCAINE 2% (20 MG/ML) 5 ML SYRINGE
INTRAMUSCULAR | Status: DC | PRN
  Administered 2023-05-23: 60 mg via INTRAVENOUS

## 2023-05-23 MED ORDER — GLYCOPYRROLATE 0.2 MG/ML IJ SOLN
INTRAMUSCULAR | Status: DC | PRN
  Administered 2023-05-23: .2 mg via INTRAVENOUS

## 2023-05-23 MED ORDER — PROPOFOL 10 MG/ML IV BOLUS
INTRAVENOUS | Status: DC | PRN
  Administered 2023-05-23: 50 mg via INTRAVENOUS
  Administered 2023-05-23: 15 mg via INTRAVENOUS
  Administered 2023-05-23: 20 mg via INTRAVENOUS

## 2023-05-23 MED ORDER — SODIUM CHLORIDE 0.9 % IV SOLN
INTRAVENOUS | Status: DC | PRN
Start: 2023-05-23 — End: 2023-05-23

## 2023-05-23 MED ORDER — DEXMEDETOMIDINE HCL IN NACL 80 MCG/20ML IV SOLN
INTRAVENOUS | Status: DC | PRN
  Administered 2023-05-23: 16 ug via INTRAVENOUS

## 2023-05-23 MED ORDER — SODIUM CHLORIDE 0.9% FLUSH: 3.0000 mL | INTRAVENOUS | Status: DC | PRN

## 2023-05-23 SURGICAL SUPPLY — 20 items
ELECT REM PT RETURN 9FT ADLT (ELECTROSURGICAL) IMPLANT
ELECTRODE REM PT RTRN 9FT ADLT (ELECTROSURGICAL) IMPLANT
FLOOR PAD 36X40 (MISCELLANEOUS) ×2 IMPLANT
FORCEPS BIOP RAD 4 LRG CAP 4 (CUTTING FORCEPS) IMPLANT
FORCEPS BIOP RJ4 240 W/NDL (CUTTING FORCEPS) IMPLANT
FORCEPS BXJMBJMB 240X2.8X (CUTTING FORCEPS) IMPLANT
INJECTOR/SNARE I SNARE (MISCELLANEOUS) IMPLANT
LUBRICANT JELLY 4.5OZ STERILE (MISCELLANEOUS) IMPLANT
MANIFOLD NEPTUNE II (INSTRUMENTS) IMPLANT
NDL SCLEROTHERAPY 25GX240 (NEEDLE) IMPLANT
NEEDLE SCLEROTHERAPY 25GX240 (NEEDLE) IMPLANT
PAD FLOOR 36X40 (MISCELLANEOUS) ×3 IMPLANT
PROBE APC STR FIRE (PROBE) IMPLANT
PROBE INJECTION GOLD 7FR (MISCELLANEOUS) IMPLANT
SNARE ROTATE MED OVAL 20MM (MISCELLANEOUS) IMPLANT
SYR 50ML LL SCALE MARK (SYRINGE) IMPLANT
TRAP SPECIMEN MUCOUS 40CC (MISCELLANEOUS) IMPLANT
TUBING ENDO SMARTCAP PENTAX (MISCELLANEOUS) IMPLANT
TUBING IRRIGATION ENDOGATOR (MISCELLANEOUS) ×3 IMPLANT
WATER STERILE IRR 1000ML POUR (IV SOLUTION) IMPLANT

## 2023-05-23 NOTE — Transfer of Care (Signed)
 Immediate Anesthesia Transfer of Care Note  Patient: Krystal French  Procedure(s) Performed: COLONOSCOPY WITH PROPOFOL POLYPECTOMY  Patient Location: PACU  Anesthesia Type:MAC  Level of Consciousness: awake, alert , oriented, and patient cooperative  Airway & Oxygen Therapy: Patient Spontanous Breathing and Patient connected to face mask oxygen  Post-op Assessment: Report given to RN and Post -op Vital signs reviewed and stable  Post vital signs: Reviewed and stable  Last Vitals:  Vitals Value Taken Time  BP    Temp    Pulse 85 05/23/23 0820  Resp 21 05/23/23 0820  SpO2 99 % 05/23/23 0820    Last Pain:  Vitals:   05/23/23 0641  TempSrc: Tympanic  PainSc: 0-No pain         Complications: No notable events documented.

## 2023-05-23 NOTE — Discharge Instructions (Signed)
 YOU HAD AN ENDOSCOPIC PROCEDURE TODAY: Refer to the procedure report and other information in the discharge instructions given to you for any specific questions about what was found during the examination. If this information does not answer your questions, please call [facility] office at [phone] to clarify.   YOU SHOULD EXPECT: Some feelings of bloating in the abdomen. Passage of more gas than usual. Walking can help get rid of the air that was put into your GI tract during the procedure and reduce the bloating. If you had a lower endoscopy (such as a colonoscopy or flexible sigmoidoscopy) you may notice spotting of blood in your stool or on the toilet paper. Some abdominal soreness may be present for a day or two, also.  DIET: Your first meal following the procedure should be a light meal and then it is ok to progress to your normal diet. A half-sandwich or bowl of soup is an example of a good first meal. Heavy or fried foods are harder to digest and may make you feel nauseous or bloated. Drink plenty of fluids but you should avoid alcoholic beverages for 24 hours. If you had a esophageal dilation, please see attached instructions for diet.    ACTIVITY: Your care partner should take you home directly after the procedure. You should plan to take it easy, moving slowly for the rest of the day. You can resume normal activity the day after the procedure however YOU SHOULD NOT DRIVE, use power tools, machinery or perform tasks that involve climbing or major physical exertion for 24 hours (because of the sedation medicines used during the test).   SYMPTOMS TO REPORT IMMEDIATELY: A gastroenterologist can be reached at any hour. Please call [phone]  for any of the following symptoms:  Following lower endoscopy (colonoscopy, flexible sigmoidoscopy) Excessive amounts of blood in the stool  Significant tenderness, worsening of abdominal pains  Swelling of the abdomen that is new, acute  Fever of 100  or higher  Following upper endoscopy (EGD, EUS, ERCP, esophageal dilation) Vomiting of blood or coffee ground material  New, significant abdominal pain  New, significant chest pain or pain under the shoulder blades  Painful or persistently difficult swallowing  New shortness of breath  Black, tarry-looking or red, bloody stools  FOLLOW UP:  If any biopsies were taken you will be contacted by phone or by letter within the next 1-3 weeks. Call [phone]  if you have not heard about the biopsies in 3 weeks.  Please also call with any specific questions about appointments or follow up tests. YOU HAD AN ENDOSCOPIC PROCEDURE TODAY: Refer to the procedure report and other information in the discharge instructions given to you for any specific questions about what was found during the examination. If this information does not answer your questions, please call [facility] office at [phone] to clarify.   YOU SHOULD EXPECT: Some feelings of bloating in the abdomen. Passage of more gas than usual. Walking can help get rid of the air that was put into your GI tract during the procedure and reduce the bloating. If you had a lower endoscopy (such as a colonoscopy or flexible sigmoidoscopy) you may notice spotting of blood in your stool or on the toilet paper. Some abdominal soreness may be present for a day or two, also.  DIET: Your first meal following the procedure should be a light meal and then it is ok to progress to your normal diet. A half-sandwich or bowl of soup is an  example of a good first meal. Heavy or fried foods are harder to digest and may make you feel nauseous or bloated. Drink plenty of fluids but you should avoid alcoholic beverages for 24 hours. If you had a esophageal dilation, please see attached instructions for diet.    ACTIVITY: Your care partner should take you home directly after the procedure. You should plan to take it easy, moving slowly for the rest of the day. You can resume  normal activity the day after the procedure however YOU SHOULD NOT DRIVE, use power tools, machinery or perform tasks that involve climbing or major physical exertion for 24 hours (because of the sedation medicines used during the test).   SYMPTOMS TO REPORT IMMEDIATELY: A gastroenterologist can be reached at any hour. Please call [phone]  for any of the following symptoms:  Following lower endoscopy (colonoscopy, flexible sigmoidoscopy) Excessive amounts of blood in the stool  Significant tenderness, worsening of abdominal pains  Swelling of the abdomen that is new, acute  Fever of 100 or higher  Following upper endoscopy (EGD, EUS, ERCP, esophageal dilation) Vomiting of blood or coffee ground material  New, significant abdominal pain  New, significant chest pain or pain under the shoulder blades  Painful or persistently difficult swallowing  New shortness of breath  Black, tarry-looking or red, bloody stools  FOLLOW UP:  If any biopsies were taken you will be contacted by phone or by letter within the next 1-3 weeks. Call [phone]  if you have not heard about the biopsies in 3 weeks.  Please also call with any specific questions about appointments or follow up tests.

## 2023-05-23 NOTE — Interval H&P Note (Signed)
 History and Physical Interval Note: 47/female for screening colonoscopy with propofol.  05/23/2023 7:40 AM  Krystal French  has presented today for colonoscopy with the diagnosis of Colon cancer screening.  The various methods of treatment have been discussed with the patient and family. After consideration of risks, benefits and other options for treatment, the patient has consented to  Procedure(s): COLONOSCOPY WITH PROPOFOL (N/A) as a surgical intervention.  The patient's history has been reviewed, patient examined, no change in status, stable for surgery.  I have reviewed the patient's chart and labs.  Questions were answered to the patient's satisfaction.     Kerin Salen

## 2023-05-23 NOTE — Op Note (Signed)
 [facility] Patient Name: Krystal French Procedure Date: 05/23/2023 MRN: [id number] Attending MD: Kerin Salen , MD, [id number] Date of Birth: 1975-10-11 CSN: [id number] Age: 48 Admit Type: Outpatient Procedure:                Colonoscopy Indications:              Screening for colorectal malignant neoplasm, This                            is the patient's first colonoscopy Providers:                Kerin Salen, MD, Rogue Jury, RN, Alan Ripper,                            Technician Referring MD:             Radonna Ricker Medicines:                Monitored Anesthesia Care Complications:            No immediate complications. Estimated Blood Loss:     Estimated blood loss: none. Procedure:                Pre-Anesthesia Assessment:                           - Prior to the procedure, a History and Physical                            was performed, and patient medications and                            allergies were reviewed. The patient's tolerance of                            previous anesthesia was also reviewed. The risks                            and benefits of the procedure and the sedation                            options and risks were discussed with the patient.                            All questions were answered, and informed consent                            was obtained. Prior Anticoagulants: The patient has                            taken no anticoagulant or antiplatelet agents. ASA                            Grade Assessment: III - A patient with severe  systemic disease. After reviewing the risks and                            benefits, the patient was deemed in satisfactory                            condition to undergo the procedure.                           After obtaining informed consent, the colonoscope                            was passed under direct vision. Throughout the                             procedure, the patient's blood pressure, pulse, and                            oxygen saturations were monitored continuously. The                            PCF-HQ190L ([id number]) Olympus colonoscope was                            introduced through the anus and advanced to the the                            terminal ileum. The colonoscopy was performed                            without difficulty. The patient tolerated the                            procedure well. The quality of the bowel                            preparation was adequate to identify polyps greater                            than 5 mm in size. The terminal ileum, ileocecal                            valve, appendiceal orifice, and rectum were                            photographed. Scope In: 7:52:19 AM Scope Out: 8:13:04 AM Scope Withdrawal Time: 0 hours 17 minutes 9 seconds  Total Procedure Duration: 0 hours 20 minutes 45 seconds  Findings:      The perianal and digital rectal examinations were normal.      The terminal ileum appeared normal.      Two sessile and semi-pedunculated polyps were found in the transverse       colon. The polyps were 6 to 12 mm in size. These polyps were removed       with  a hot and cold snare. Resection and retrieval were complete.      Non-bleeding internal hemorrhoids were found during retroflexion.      The exam was otherwise without abnormality. Impression:               - The examined portion of the ileum was normal.                           - Two 6 to 12 mm polyps in the transverse colon,                            removed with a hot snare. Resected and retrieved.                           - Non-bleeding internal hemorrhoids.                           - The examination was otherwise normal. Moderate Sedation:      Patient did not receive moderate sedation for this procedure, but       instead received monitored anesthesia care. Recommendation:           - Patient has a contact  number available for                            emergencies. The signs and symptoms of potential                            delayed complications were discussed with the                            patient. Return to normal activities tomorrow.                            Written discharge instructions were provided to the                            patient.                           - Resume regular diet.                           - Continue present medications.                           - Await pathology results.                           - Repeat colonoscopy for surveillance based on                            pathology results. Procedure Code(s):        --- Professional ---                           [phone], Colonoscopy, flexible; with removal of  tumor(s), polyp(s), or other lesion(s) by snare                            technique Diagnosis Code(s):        --- Professional ---                           Z12.11, Encounter for screening for malignant                            neoplasm of colon                           K64.8, Other hemorrhoids                           D12.3, Benign neoplasm of transverse colon (hepatic                            flexure or splenic flexure) CPT copyright 2022 American Medical Association. All rights reserved. The codes documented in this report are preliminary and upon coder review may  be revised to meet current compliance requirements. Kerin Salen, MD 05/23/2023 8:21:02 AM This report has been signed electronically. Number of Addenda: 0

## 2023-05-23 NOTE — Anesthesia Postprocedure Evaluation (Signed)
 Anesthesia Post Note  Patient: [name]  Procedure(s) Performed: COLONOSCOPY WITH PROPOFOL POLYPECTOMY     Patient location during evaluation: PACU Anesthesia Type: MAC Level of consciousness: awake and alert Pain management: pain level controlled Vital Signs Assessment: post-procedure vital signs reviewed and stable Respiratory status: spontaneous breathing, nonlabored ventilation and respiratory function stable Cardiovascular status: stable and blood pressure returned to baseline Anesthetic complications: no   No notable events documented.  Last Vitals:  Vitals:   05/23/23 0830 05/23/23 0835  BP: 125/89 109/82  Pulse: 84 75  Resp: 16 13  Temp:    SpO2: 100% 100%    Last Pain:  Vitals:   05/23/23 0835  TempSrc:   PainSc: 0-No pain                 Beryle Lathe

## 2023-05-24 LAB — SURGICAL PATHOLOGY

## 2023-05-25 ENCOUNTER — Encounter (HOSPITAL_COMMUNITY): Payer: Self-pay | Admitting: Gastroenterology

## 2023-06-15 ENCOUNTER — Ambulatory Visit: Admitting: Internal Medicine

## 2023-06-21 DIAGNOSIS — F3131 Bipolar disorder, current episode depressed, mild: Secondary | ICD-10-CM | POA: Diagnosis not present

## 2023-06-21 DIAGNOSIS — M1712 Unilateral primary osteoarthritis, left knee: Secondary | ICD-10-CM | POA: Diagnosis not present

## 2023-06-22 DIAGNOSIS — Z79891 Long term (current) use of opiate analgesic: Secondary | ICD-10-CM | POA: Diagnosis not present

## 2023-06-22 DIAGNOSIS — G894 Chronic pain syndrome: Secondary | ICD-10-CM | POA: Diagnosis not present

## 2023-06-22 DIAGNOSIS — M5451 Vertebrogenic low back pain: Secondary | ICD-10-CM | POA: Diagnosis not present

## 2023-06-22 DIAGNOSIS — M25569 Pain in unspecified knee: Secondary | ICD-10-CM | POA: Diagnosis not present

## 2023-06-22 DIAGNOSIS — M2550 Pain in unspecified joint: Secondary | ICD-10-CM | POA: Diagnosis not present

## 2023-06-22 DIAGNOSIS — M797 Fibromyalgia: Secondary | ICD-10-CM | POA: Diagnosis not present

## 2023-06-22 DIAGNOSIS — K5903 Drug induced constipation: Secondary | ICD-10-CM | POA: Diagnosis not present

## 2023-06-22 DIAGNOSIS — E282 Polycystic ovarian syndrome: Secondary | ICD-10-CM | POA: Diagnosis not present

## 2023-06-22 DIAGNOSIS — G43109 Migraine with aura, not intractable, without status migrainosus: Secondary | ICD-10-CM | POA: Diagnosis not present

## 2023-06-28 ENCOUNTER — Ambulatory Visit: Admitting: Internal Medicine

## 2023-07-04 DIAGNOSIS — H61002 Unspecified perichondritis of left external ear: Secondary | ICD-10-CM | POA: Diagnosis not present

## 2023-07-19 DIAGNOSIS — G4733 Obstructive sleep apnea (adult) (pediatric): Secondary | ICD-10-CM | POA: Diagnosis not present

## 2023-07-19 DIAGNOSIS — R400 Somnolence: Secondary | ICD-10-CM | POA: Diagnosis not present

## 2023-07-19 DIAGNOSIS — F341 Dysthymic disorder: Secondary | ICD-10-CM | POA: Diagnosis not present

## 2023-08-15 DIAGNOSIS — Z01419 Encounter for gynecological examination (general) (routine) without abnormal findings: Secondary | ICD-10-CM | POA: Diagnosis not present

## 2023-08-15 DIAGNOSIS — E282 Polycystic ovarian syndrome: Secondary | ICD-10-CM | POA: Diagnosis not present

## 2023-08-15 DIAGNOSIS — Z3041 Encounter for surveillance of contraceptive pills: Secondary | ICD-10-CM | POA: Diagnosis not present

## 2023-08-15 DIAGNOSIS — Z8742 Personal history of other diseases of the female genital tract: Secondary | ICD-10-CM | POA: Diagnosis not present

## 2023-08-16 DIAGNOSIS — M797 Fibromyalgia: Secondary | ICD-10-CM | POA: Diagnosis not present

## 2023-08-16 DIAGNOSIS — G43109 Migraine with aura, not intractable, without status migrainosus: Secondary | ICD-10-CM | POA: Diagnosis not present

## 2023-08-16 DIAGNOSIS — G894 Chronic pain syndrome: Secondary | ICD-10-CM | POA: Diagnosis not present

## 2023-08-16 DIAGNOSIS — E282 Polycystic ovarian syndrome: Secondary | ICD-10-CM | POA: Diagnosis not present

## 2023-08-16 DIAGNOSIS — M2550 Pain in unspecified joint: Secondary | ICD-10-CM | POA: Diagnosis not present

## 2023-08-16 DIAGNOSIS — K5903 Drug induced constipation: Secondary | ICD-10-CM | POA: Diagnosis not present

## 2023-08-16 DIAGNOSIS — M5451 Vertebrogenic low back pain: Secondary | ICD-10-CM | POA: Diagnosis not present

## 2023-08-16 DIAGNOSIS — M25569 Pain in unspecified knee: Secondary | ICD-10-CM | POA: Diagnosis not present

## 2023-08-16 DIAGNOSIS — Z79891 Long term (current) use of opiate analgesic: Secondary | ICD-10-CM | POA: Diagnosis not present

## 2023-09-08 ENCOUNTER — Ambulatory Visit: Admitting: Internal Medicine

## 2023-09-11 DIAGNOSIS — H61002 Unspecified perichondritis of left external ear: Secondary | ICD-10-CM | POA: Diagnosis not present

## 2023-09-14 DIAGNOSIS — M25569 Pain in unspecified knee: Secondary | ICD-10-CM | POA: Diagnosis not present

## 2023-09-14 DIAGNOSIS — G43109 Migraine with aura, not intractable, without status migrainosus: Secondary | ICD-10-CM | POA: Diagnosis not present

## 2023-09-14 DIAGNOSIS — G894 Chronic pain syndrome: Secondary | ICD-10-CM | POA: Diagnosis not present

## 2023-09-14 DIAGNOSIS — M5451 Vertebrogenic low back pain: Secondary | ICD-10-CM | POA: Diagnosis not present

## 2023-09-14 DIAGNOSIS — K5903 Drug induced constipation: Secondary | ICD-10-CM | POA: Diagnosis not present

## 2023-09-14 DIAGNOSIS — Z79891 Long term (current) use of opiate analgesic: Secondary | ICD-10-CM | POA: Diagnosis not present

## 2023-09-14 DIAGNOSIS — M797 Fibromyalgia: Secondary | ICD-10-CM | POA: Diagnosis not present

## 2023-09-14 DIAGNOSIS — E282 Polycystic ovarian syndrome: Secondary | ICD-10-CM | POA: Diagnosis not present

## 2023-09-14 DIAGNOSIS — M2550 Pain in unspecified joint: Secondary | ICD-10-CM | POA: Diagnosis not present

## 2023-10-06 ENCOUNTER — Encounter: Payer: Self-pay | Admitting: Internal Medicine

## 2023-10-06 ENCOUNTER — Ambulatory Visit: Attending: Internal Medicine | Admitting: Internal Medicine

## 2023-10-06 VITALS — BP 118/82 | Ht 70.5 in | Wt 368.5 lb

## 2023-10-06 DIAGNOSIS — I471 Supraventricular tachycardia, unspecified: Secondary | ICD-10-CM | POA: Diagnosis not present

## 2023-10-06 NOTE — Patient Instructions (Signed)
 Medication Instructions:  Your physician recommends that you continue on your current medications as directed. Please refer to the Current Medication list given to you today.  *If you need a refill on your cardiac medications before your next appointment, please call your pharmacy*  Lab Work: None ordered.  You may go to any [facility] Location for your lab work:  [facility] - [address] ([facility]) - [address] [phone]  [place] - [address]   [place]  - [address]   [place] - [address] - [address] [employer]  - [address] [place] - [address] ([facility]   If you have labs (blood work) drawn today and your tests are completely normal, you will receive your results only by: [employer] (if you have MyChart)  If you have any lab test that is abnormal or we need to change your treatment, we will call you or send a MyChart message to review the results.  Testing/Procedures: None ordered.  Follow-Up: At [facility], you and your health needs are our priority.  As part of our continuing mission to provide you with exceptional heart care, we have created designated Provider Care Teams.  These Care Teams include your primary Cardiologist (physician) and Advanced Practice Providers (APPs -  Physician Assistants and Nurse Practitioners) who all work together to provide you with the care you need, when you need it.  We recommend signing up for the patient portal called MyChart.  Sign up information is provided on this After Visit Summary.  MyChart is used to connect with patients for Virtual Visits (Telemedicine).  Patients are able to view lab/test results, encounter notes, upcoming appointments, etc.  Non-urgent messages can be sent to your provider as well.   To learn more about what you can do with MyChart, go to  [email or web address].    Your next appointment:   As needed  The format for your next appointment:   In Person  Provider:   Donnice Primus, MD or one of the following Advanced Practice Providers on your designated Care Team:   Charlies Arthur, [place] Ozell Jodie Passey, [place] Leotis Barrack, NP  Note: Remote monitoring is used to monitor your Pacemaker/ ICD from home. This monitoring reduces the number of office visits required to check your device to one time per year. It allows us  to keep an eye on the functioning of your device to ensure it is working properly.

## 2023-10-06 NOTE — Progress Notes (Signed)
 HPI [name] returns today for followup. She is a pleasant morbidly obese woman with SVT who I saw almost 2 years ago. She has gained almost 100 lbs. She notes that her episodes of SVT are improved and describes no episodes lasting over 5 minutes in the last year. She has not had to seek medical attention. The patient does not drink ETOH or abuse caffeine.  Allergies  Allergen Reactions   Codeine    Morphine  And Codeine    Latex     Makes patient skin peal off   Sulfa Antibiotics     coma     Current Outpatient Medications  Medication Sig Dispense Refill   ARIPiprazole (ABILIFY) 10 MG tablet Take 10 mg by mouth at bedtime.     Armodafinil 200 MG TABS Take 1 tablet by mouth daily. Patient states 250 MG daily     BELBUCA 750 MCG FILM Take 750 mcg by mouth 2 (two) times daily.     clonazePAM (KLONOPIN) 1 MG tablet 3 (three) times daily as needed.     cyclobenzaprine (FLEXERIL) 10 MG tablet      fish oil-omega-3 fatty acids 1000 MG capsule Take 1 g by mouth daily.     fluticasone (FLONASE) 50 MCG/ACT nasal spray fluticasone propionate 50 mcg/actuation nasal spray,suspension     folic acid (FOLVITE) 1 MG tablet Take 1 mg by mouth daily.     HYDROmorphone  (DILAUDID ) 4 MG tablet Take 4 mg by mouth at bedtime.     LARIN FE 1.5/30 1.5-30 MG-MCG tablet Take 1 tablet by mouth daily.     metFORMIN (GLUCOPHAGE) 500 MG tablet Take 500 mg by mouth.     Methylphenidate HCl ER, OSM, 72 MG TBCR      metoprolol  succinate (TOPROL -XL) 50 MG 24 hr tablet Take 1 tablet (50 mg total) by mouth daily. Take with or immediately following a meal. 90 tablet 3   NARCAN 4 MG/0.1ML LIQD nasal spray kit SMARTSIG:1 Spray(s) Both Nares Once PRN     ondansetron  (ZOFRAN ) 4 MG tablet Take 4 mg by mouth every 8 (eight) hours as needed for nausea.     prazosin (MINIPRESS) 2 MG capsule at bedtime.     Ubrogepant (UBRELVY) 100 MG TABS      zaleplon (SONATA) 10 MG capsule Take 10 mg by mouth at bedtime as needed.      zonisamide (ZONEGRAN) 100 MG capsule Take 200 mg by mouth at bedtime.      No current facility-administered medications for this visit.     Past Medical History:  Diagnosis Date   Anxiety    Bipolar 2 disorder (HCC)    Fibromyalgia    Migraine    Morbid obesity (HCC)    Neuropathy    Polycystic ovaries    Sleep apnea    CPAP    ROS:   All systems reviewed and negative except as noted in the HPI.   Past Surgical History:  Procedure Laterality Date   CHOLECYSTECTOMY     COLONOSCOPY WITH PROPOFOL  N/A 05/23/2023   Procedure: COLONOSCOPY WITH PROPOFOL ;  Surgeon: Saintclair Jasper, MD;  Location: [facility];  Service: Gastroenterology;  Laterality: N/A;   knee arthroscopic     POLYPECTOMY  05/23/2023   Procedure: POLYPECTOMY;  Surgeon: Saintclair Jasper, MD;  Location: [facility];  Service: Gastroenterology;;   WISDOM TOOTH EXTRACTION       Family History  Problem Relation Age of Onset   Fibromyalgia Mother  Parkinsonism Father    Sleep apnea Father    High Cholesterol Father    Dementia Father    Hypertension Father    ADD / ADHD Father    CAD Father 30   Dementia Paternal Grandfather      Social History   Socioeconomic History   Marital status: Married    Spouse name: Not on file   Number of children: 0   Years of education: Not on file   Highest education level: Not on file  Occupational History   Not on file  Tobacco Use   Smoking status: Former    Current packs/day: 0.00    Average packs/day: 0.5 packs/day for 2.0 years (1.0 ttl pk-yrs)    Types: Cigarettes    Start date: 49    Quit date: 61    Years since quitting: 29.6   Smokeless tobacco: Never  Vaping Use   Vaping status: Never Used  Substance and Sexual Activity   Alcohol use: No   Drug use: No    Comment: quit in 2006   Sexual activity: Not Currently  Other Topics Concern   Not on file  Social History Narrative   Lives at home with husband.      Social Drivers of Manufacturing engineer Strain: Not on file  Food Insecurity: Not on file  Transportation Needs: Not on file  Physical Activity: Not on file  Stress: Not on file  Social Connections: Not on file  Intimate Partner Violence: Not on file     BP 118/82   Ht 5' 10.5 (1.791 m)   Wt (!) 368 lb 8 oz (167.2 kg)   SpO2 97%   BMI 52.13 kg/m   Physical Exam:  Morbidly obese appearing NAD HEENT: Unremarkable Neck:  No JVD, no thyromegally Lymphatics:  No adenopathy Back:  No CVA tenderness Lungs:  Clear with no wheezes HEART:  Regular rate rhythm, no murmurs, no rubs, no clicks Abd:  soft, positive bowel sounds, no organomegally, no rebound, no guarding Ext:  2 plus pulses, no edema, no cyanosis, no clubbing Skin:  No rashes no nodules Neuro:  CN II through XII intact, motor grossly intact  EKG  - NSR  Assess/Plan: SVT - I have recommended watchful waiting. She does not have enough SVT to recommend catheter ablation. She will continue low dose toprol . She will call us  if her symptoms worsen. Obesity - hopefully she can lose weight.  Krystal Kynsie Falkner,MD

## 2023-10-12 DIAGNOSIS — K5903 Drug induced constipation: Secondary | ICD-10-CM | POA: Diagnosis not present

## 2023-10-12 DIAGNOSIS — M797 Fibromyalgia: Secondary | ICD-10-CM | POA: Diagnosis not present

## 2023-10-12 DIAGNOSIS — M2550 Pain in unspecified joint: Secondary | ICD-10-CM | POA: Diagnosis not present

## 2023-10-12 DIAGNOSIS — M5451 Vertebrogenic low back pain: Secondary | ICD-10-CM | POA: Diagnosis not present

## 2023-10-12 DIAGNOSIS — G894 Chronic pain syndrome: Secondary | ICD-10-CM | POA: Diagnosis not present

## 2023-10-12 DIAGNOSIS — E282 Polycystic ovarian syndrome: Secondary | ICD-10-CM | POA: Diagnosis not present

## 2023-10-12 DIAGNOSIS — G43109 Migraine with aura, not intractable, without status migrainosus: Secondary | ICD-10-CM | POA: Diagnosis not present

## 2023-10-12 DIAGNOSIS — M25569 Pain in unspecified knee: Secondary | ICD-10-CM | POA: Diagnosis not present

## 2023-10-12 DIAGNOSIS — Z79891 Long term (current) use of opiate analgesic: Secondary | ICD-10-CM | POA: Diagnosis not present

## 2023-10-19 DIAGNOSIS — G4733 Obstructive sleep apnea (adult) (pediatric): Secondary | ICD-10-CM | POA: Diagnosis not present

## 2023-10-19 DIAGNOSIS — F341 Dysthymic disorder: Secondary | ICD-10-CM | POA: Diagnosis not present

## 2023-10-19 DIAGNOSIS — R400 Somnolence: Secondary | ICD-10-CM | POA: Diagnosis not present

## 2023-10-23 DIAGNOSIS — H61002 Unspecified perichondritis of left external ear: Secondary | ICD-10-CM | POA: Diagnosis not present

## 2023-11-30 DIAGNOSIS — H61002 Unspecified perichondritis of left external ear: Secondary | ICD-10-CM | POA: Diagnosis not present

## 2023-12-07 DIAGNOSIS — M797 Fibromyalgia: Secondary | ICD-10-CM | POA: Diagnosis not present

## 2023-12-07 DIAGNOSIS — Z79891 Long term (current) use of opiate analgesic: Secondary | ICD-10-CM | POA: Diagnosis not present

## 2023-12-07 DIAGNOSIS — Z79899 Other long term (current) drug therapy: Secondary | ICD-10-CM | POA: Diagnosis not present

## 2023-12-07 DIAGNOSIS — M5451 Vertebrogenic low back pain: Secondary | ICD-10-CM | POA: Diagnosis not present

## 2023-12-07 DIAGNOSIS — M2550 Pain in unspecified joint: Secondary | ICD-10-CM | POA: Diagnosis not present

## 2023-12-07 DIAGNOSIS — M25569 Pain in unspecified knee: Secondary | ICD-10-CM | POA: Diagnosis not present

## 2023-12-07 DIAGNOSIS — G43109 Migraine with aura, not intractable, without status migrainosus: Secondary | ICD-10-CM | POA: Diagnosis not present

## 2023-12-07 DIAGNOSIS — K5903 Drug induced constipation: Secondary | ICD-10-CM | POA: Diagnosis not present

## 2023-12-07 DIAGNOSIS — E282 Polycystic ovarian syndrome: Secondary | ICD-10-CM | POA: Diagnosis not present

## 2023-12-07 DIAGNOSIS — G894 Chronic pain syndrome: Secondary | ICD-10-CM | POA: Diagnosis not present

## 2023-12-25 DIAGNOSIS — Z5181 Encounter for therapeutic drug level monitoring: Secondary | ICD-10-CM | POA: Diagnosis not present

## 2023-12-25 DIAGNOSIS — F900 Attention-deficit hyperactivity disorder, predominantly inattentive type: Secondary | ICD-10-CM | POA: Diagnosis not present

## 2023-12-25 DIAGNOSIS — F3112 Bipolar disorder, current episode manic without psychotic features, moderate: Secondary | ICD-10-CM | POA: Diagnosis not present

## 2023-12-25 DIAGNOSIS — F3131 Bipolar disorder, current episode depressed, mild: Secondary | ICD-10-CM | POA: Diagnosis not present

## 2023-12-25 DIAGNOSIS — F4323 Adjustment disorder with mixed anxiety and depressed mood: Secondary | ICD-10-CM | POA: Diagnosis not present

## 2023-12-27 DIAGNOSIS — M25562 Pain in left knee: Secondary | ICD-10-CM | POA: Diagnosis not present

## 2023-12-27 DIAGNOSIS — G8929 Other chronic pain: Secondary | ICD-10-CM | POA: Diagnosis not present

## 2023-12-27 DIAGNOSIS — M1712 Unilateral primary osteoarthritis, left knee: Secondary | ICD-10-CM | POA: Diagnosis not present

## 2024-01-11 ENCOUNTER — Other Ambulatory Visit: Payer: Self-pay | Admitting: Cardiology

## 2024-01-19 DIAGNOSIS — F341 Dysthymic disorder: Secondary | ICD-10-CM | POA: Diagnosis not present

## 2024-01-19 DIAGNOSIS — R400 Somnolence: Secondary | ICD-10-CM | POA: Diagnosis not present

## 2024-01-19 DIAGNOSIS — G4733 Obstructive sleep apnea (adult) (pediatric): Secondary | ICD-10-CM | POA: Diagnosis not present

## 2024-01-31 ENCOUNTER — Emergency Department (HOSPITAL_COMMUNITY)
Admission: EM | Admit: 2024-01-31 | Discharge: 2024-01-31 | Disposition: A | Discharge status: Home / Self Care | Attending: Emergency Medicine | Admitting: Emergency Medicine

## 2024-01-31 ENCOUNTER — Other Ambulatory Visit: Payer: Self-pay

## 2024-01-31 ENCOUNTER — Encounter (HOSPITAL_COMMUNITY): Payer: Self-pay

## 2024-01-31 DIAGNOSIS — M79662 Pain in left lower leg: Secondary | ICD-10-CM

## 2024-01-31 DIAGNOSIS — M79669 Pain in unspecified lower leg: Secondary | ICD-10-CM | POA: Diagnosis not present

## 2024-01-31 LAB — PREGNANCY, URINE: Preg Test, Ur: NEGATIVE

## 2024-01-31 MED ORDER — APIXABAN 5 MG PO TABS
10.0000 mg | ORAL_TABLET | Freq: Once | ORAL | Status: AC
  Administered 2024-01-31: 10 mg via ORAL
  Filled 2024-01-31: qty 2

## 2024-01-31 NOTE — Discharge Instructions (Addendum)
 Return tomorrow for US  tomorrow.  We should call you for an appointment early tomorrow morning however we do not, please call [facility] to schedule an appointment  Return to ED if you experience chest pain, shortness of breath, dizziness, lightheadedness, worsening symptoms

## 2024-01-31 NOTE — ED Provider Triage Note (Signed)
 Emergency Medicine Provider Triage Evaluation Note  Yoselin Amerman , a 48 y.o. female  was evaluated in triage.  Pt complains of LLE swelling and pain for past 2 days. Had elevated dimer by PCP. No complaints cp, shob, hx of DVT/PE, recent travel, surgery  Review of Systems  Positive: See hpi Negative:   Physical Exam  BP (!) 136/100 (BP Location: Right Arm)   Pulse (!) 113   Temp 98.5 F (36.9 C) (Oral)   Resp 18   Ht 5' 10.5 (1.791 m)   Wt (!) 167.2 kg   SpO2 98%   BMI 52.14 kg/m  Gen:   Awake, no distress   Resp:  Normal effort  MSK:   Moves extremities without difficulty  Other:  Posterior calf tenderness to LLE. Positive homan sign. Mild generalized swelling below calf. DP2+ bilaterally. No calf tenderness to RLE  Medical Decision Making  Medically screening exam initiated at 7:25 PM.  Appropriate orders placed.  Naomi Pines was informed that the remainder of the evaluation will be completed by another provider, this initial triage assessment does not replace that evaluation, and the importance of remaining in the ED until their evaluation is complete.  US  not available at this time   Minnie Tinnie BRAVO, [place] 01/31/24 1926

## 2024-01-31 NOTE — ED Triage Notes (Signed)
 Pt came in POV, states she saw her PCP this morning for L. Calf pain that started yesterday. Blood work resulted w/elevated D-Dimer. PCP advised her to come for ultrasound. Mild swelling & painful when palpating area. Denies SOB, N&V. NAD

## 2024-01-31 NOTE — ED Provider Notes (Signed)
 [place] EMERGENCY DEPARTMENT AT [facility] Provider Note   CSN: [id number] Arrival date & time: 01/31/24  8176     Patient presents with: Leg Pain   Krystal French is a 48 y.o. female.  {Add pertinent medical, surgical, social history, OB history to HPI:32947}  Leg Pain      Prior to Admission medications   Medication Sig Start Date End Date Taking? Authorizing Provider  ARIPiprazole (ABILIFY) 10 MG tablet Take 10 mg by mouth at bedtime. 09/18/21   [provider]  Armodafinil 200 MG TABS Take 1 tablet by mouth daily. Patient states 250 MG daily 04/22/19   [provider]  BELBUCA 750 MCG FILM Take 750 mcg by mouth 2 (two) times daily. 09/13/21   [provider]  clonazePAM (KLONOPIN) 1 MG tablet 3 (three) times daily as needed. 06/03/19   [provider]  cyclobenzaprine (FLEXERIL) 10 MG tablet  09/13/21   [provider]  fish oil-omega-3 fatty acids 1000 MG capsule Take 1 g by mouth daily.    [provider]  fluticasone (FLONASE) 50 MCG/ACT nasal spray fluticasone propionate 50 mcg/actuation nasal spray,suspension    [provider]  folic acid (FOLVITE) 1 MG tablet Take 1 mg by mouth daily.    [provider]  HYDROmorphone  (DILAUDID ) 4 MG tablet Take 4 mg by mouth at bedtime. 06/03/19   [provider]  LARIN FE 1.5/30 1.5-30 MG-MCG tablet Take 1 tablet by mouth daily. 06/03/19   [provider]  metFORMIN (GLUCOPHAGE) 500 MG tablet Take 500 mg by mouth. 06/03/19   [provider]  Methylphenidate HCl ER, OSM, 72 MG TBCR  04/19/21   [provider]  metoprolol  succinate (TOPROL -XL) 50 MG 24 hr tablet TAKE ONE TABLET BY MOUTH ONCE A DAY WITH OR IMMEDIATELY FOLLOWING A MEAL 01/12/24   Lavona Agent, MD  NARCAN 4 MG/0.1ML LIQD nasal spray kit SMARTSIG:1 Spray(s) Both Nares Once PRN 01/28/19   [provider]  ondansetron  (ZOFRAN ) 4 MG tablet Take 4 mg by  mouth every 8 (eight) hours as needed for nausea.    [provider]  prazosin (MINIPRESS) 2 MG capsule at bedtime. 09/18/21   [provider]  Ubrogepant (UBRELVY) 100 MG TABS     [provider]  zaleplon (SONATA) 10 MG capsule Take 10 mg by mouth at bedtime as needed. 06/20/19   [provider]  zonisamide (ZONEGRAN) 100 MG capsule Take 200 mg by mouth at bedtime.  06/26/19   [provider]    Allergies: Codeine, Morphine  and codeine, Latex, and Sulfa antibiotics    Review of Systems  Updated Vital Signs BP (!) 136/100 (BP Location: Right Arm)   Pulse (!) 113   Temp 98.5 F (36.9 C) (Oral)   Resp 18   Ht 5' 10.5 (1.791 m)   Wt (!) 167.2 kg   SpO2 98%   BMI 52.14 kg/m   Physical Exam  (all labs ordered are listed, but only abnormal results are displayed) Labs Reviewed - No data to display  EKG: None  Radiology: No results found.  {Document cardiac monitor, telemetry assessment procedure when appropriate:32947} Procedures   Medications Ordered in the ED  apixaban  (ELIQUIS ) tablet 10 mg (has no administration in time range)      {Click here for ABCD2, HEART and other calculators REFRESH Note before signing:1}  Medical Decision Making Risk Prescription drug management.   ***  {Document critical care time when appropriate  Document review of labs and clinical decision tools ie CHADS2VASC2, etc  Document your independent review of radiology images and any outside records  Document your discussion with family members, caretakers and with consultants  Document social determinants of health affecting pt's care  Document your decision making why or why not admission, treatments were needed:32947:::1}   Final diagnoses:  Pain of left calf    ED Discharge Orders          Ordered    LE Venous       Comments: IMPORTANT PATIENT INSTRUCTIONS: You have been scheduled for an Outpatient Vascular  Study at [facility].  If tomorrow is a Saturday, Sunday or holiday, please go to the [facility] Emergency Department Registration Desk at 11 am tomorrow morning and tell them you are there for a vascular study.  If tomorrow is a weekday (Monday-Friday), please go to the [facility] (address [address] [place]) at 8 am and report to the 4th floor registration Zone A.  Inform registration that you are there for a vascular study.   01/31/24 1933

## 2024-02-01 ENCOUNTER — Ambulatory Visit (HOSPITAL_COMMUNITY)
Admission: RE | Admit: 2024-02-01 | Discharge: 2024-02-01 | Disposition: A | Source: Ambulatory Visit | Attending: Emergency Medicine

## 2024-02-01 DIAGNOSIS — M79662 Pain in left lower leg: Secondary | ICD-10-CM

## 2024-02-07 DIAGNOSIS — I471 Supraventricular tachycardia, unspecified: Secondary | ICD-10-CM | POA: Diagnosis not present

## 2024-02-07 DIAGNOSIS — G43709 Chronic migraine without aura, not intractable, without status migrainosus: Secondary | ICD-10-CM | POA: Diagnosis not present

## 2024-02-07 DIAGNOSIS — E782 Mixed hyperlipidemia: Secondary | ICD-10-CM | POA: Diagnosis not present

## 2024-02-07 DIAGNOSIS — Z23 Encounter for immunization: Secondary | ICD-10-CM | POA: Diagnosis not present

## 2024-02-07 DIAGNOSIS — G894 Chronic pain syndrome: Secondary | ICD-10-CM | POA: Diagnosis not present

## 2024-02-07 DIAGNOSIS — Z0000 Encounter for general adult medical examination without abnormal findings: Secondary | ICD-10-CM | POA: Diagnosis not present

## 2024-02-07 DIAGNOSIS — E282 Polycystic ovarian syndrome: Secondary | ICD-10-CM | POA: Diagnosis not present

## 2024-02-07 DIAGNOSIS — F319 Bipolar disorder, unspecified: Secondary | ICD-10-CM | POA: Diagnosis not present

## 2024-02-07 DIAGNOSIS — M797 Fibromyalgia: Secondary | ICD-10-CM | POA: Diagnosis not present

## 2024-02-07 DIAGNOSIS — G4733 Obstructive sleep apnea (adult) (pediatric): Secondary | ICD-10-CM | POA: Diagnosis not present

## 2024-02-07 DIAGNOSIS — E038 Other specified hypothyroidism: Secondary | ICD-10-CM | POA: Diagnosis not present

## 2024-03-06 NOTE — Progress Notes (Unsigned)
" °  Cardiology Office Note:   Date:  03/06/2024  ID:  Krystal French, DOB 1975-10-12, MRN [id number] PCP: [facility]  [facility] Providers Cardiologist:  Lynwood Schilling, MD {  History of Present Illness:   Lucillia Corson is a 49 y.o. female who I last saw in 2023 with palpitations.  She had frequent runs of SVT.    She was treated with beta blocker.    ***   ROS: ***  Studies Reviewed:    EKG:       ***  Risk Assessment/Calculations:   {Does this patient have ATRIAL FIBRILLATION?:[phone]} No BP recorded.  {Refresh Note OR Click here to enter BP  :1}***        Physical Exam:   VS:  There were no vitals taken for this visit.   Wt Readings from Last 3 Encounters:  01/31/24 (!) 368 lb 9.8 oz (167.2 kg)  10/06/23 (!) 368 lb 8 oz (167.2 kg)  05/23/23 (!) 351 lb 6.6 oz (159.4 kg)     GEN: Well nourished, well developed in no acute distress NECK: No JVD; No carotid bruits CARDIAC: ***RR, *** murmurs, rubs, gallops RESPIRATORY:  Clear to auscultation without rales, wheezing or rhonchi  ABDOMEN: Soft, non-tender, non-distended EXTREMITIES:  No edema; No deformity   ASSESSMENT AND PLAN:   Palpitations:  ***      Follow up ***  Signed, Lynwood Schilling, MD   "

## 2024-03-07 ENCOUNTER — Ambulatory Visit: Admitting: Cardiology

## 2024-03-07 DIAGNOSIS — R002 Palpitations: Secondary | ICD-10-CM

## 2024-03-27 ENCOUNTER — Ambulatory Visit: Admitting: Cardiology

## 2024-04-05 ENCOUNTER — Other Ambulatory Visit: Payer: Self-pay | Admitting: Cardiology

## 2024-04-19 ENCOUNTER — Ambulatory Visit: Admitting: Cardiology
